# Patient Record
Sex: Male | Born: 1950 | Race: White | Hispanic: No | Marital: Married | Smoking: Never smoker
Health system: Southern US, Community
[De-identification: ages and names within clinical notes are randomized; demographics above are authoritative.]

## PROBLEM LIST (undated history)

## (undated) DIAGNOSIS — I82401 Acute embolism and thrombosis of unspecified deep veins of right lower extremity: Secondary | ICD-10-CM

## (undated) DIAGNOSIS — N200 Calculus of kidney: Secondary | ICD-10-CM

## (undated) DIAGNOSIS — Z973 Presence of spectacles and contact lenses: Secondary | ICD-10-CM

## (undated) DIAGNOSIS — I2699 Other pulmonary embolism without acute cor pulmonale: Secondary | ICD-10-CM

## (undated) DIAGNOSIS — Z87442 Personal history of urinary calculi: Secondary | ICD-10-CM

## (undated) DIAGNOSIS — R0602 Shortness of breath: Secondary | ICD-10-CM

## (undated) DIAGNOSIS — R911 Solitary pulmonary nodule: Secondary | ICD-10-CM

## (undated) HISTORY — PX: KNEE ARTHROSCOPY W/ ACL RECONSTRUCTION: SHX1858

## (undated) HISTORY — PX: LITHOTRIPSY: SUR834

## (undated) HISTORY — PX: COLONOSCOPY: SHX174

---

## 1979-06-30 HISTORY — PX: KNEE ARTHROSCOPY W/ ACL RECONSTRUCTION: SHX1858

## 1989-06-29 HISTORY — PX: LITHOTRIPSY: SUR834

## 2000-01-19 ENCOUNTER — Emergency Department (HOSPITAL_COMMUNITY): Admission: EM | Admit: 2000-01-19 | Discharge: 2000-01-20 | Payer: Self-pay | Admitting: Emergency Medicine

## 2001-12-16 ENCOUNTER — Emergency Department (HOSPITAL_COMMUNITY): Admission: EM | Admit: 2001-12-16 | Discharge: 2001-12-16 | Payer: Self-pay | Admitting: *Deleted

## 2001-12-16 ENCOUNTER — Encounter: Payer: Self-pay | Admitting: *Deleted

## 2004-10-28 ENCOUNTER — Ambulatory Visit: Payer: Self-pay | Admitting: Family Medicine

## 2004-11-02 ENCOUNTER — Ambulatory Visit: Payer: Self-pay | Admitting: Internal Medicine

## 2005-05-14 ENCOUNTER — Ambulatory Visit: Payer: Self-pay | Admitting: Internal Medicine

## 2005-05-16 ENCOUNTER — Ambulatory Visit: Payer: Self-pay | Admitting: Internal Medicine

## 2005-11-06 ENCOUNTER — Ambulatory Visit: Payer: Self-pay | Admitting: Internal Medicine

## 2005-12-05 ENCOUNTER — Ambulatory Visit: Payer: Self-pay | Admitting: Internal Medicine

## 2006-02-14 ENCOUNTER — Ambulatory Visit: Payer: Self-pay | Admitting: Internal Medicine

## 2007-08-25 DIAGNOSIS — Z860100 Personal history of colon polyps, unspecified: Secondary | ICD-10-CM | POA: Insufficient documentation

## 2007-08-25 DIAGNOSIS — Z87442 Personal history of urinary calculi: Secondary | ICD-10-CM | POA: Insufficient documentation

## 2007-08-25 DIAGNOSIS — L719 Rosacea, unspecified: Secondary | ICD-10-CM

## 2007-08-26 ENCOUNTER — Ambulatory Visit: Payer: Self-pay | Admitting: Internal Medicine

## 2007-08-26 DIAGNOSIS — N401 Enlarged prostate with lower urinary tract symptoms: Secondary | ICD-10-CM

## 2007-08-26 DIAGNOSIS — E782 Mixed hyperlipidemia: Secondary | ICD-10-CM | POA: Insufficient documentation

## 2007-08-26 DIAGNOSIS — N138 Other obstructive and reflux uropathy: Secondary | ICD-10-CM

## 2007-09-03 ENCOUNTER — Ambulatory Visit: Payer: Self-pay | Admitting: Internal Medicine

## 2007-09-10 ENCOUNTER — Encounter (INDEPENDENT_AMBULATORY_CARE_PROVIDER_SITE_OTHER): Payer: Self-pay | Admitting: *Deleted

## 2007-09-10 LAB — CONVERTED CEMR LAB
ALT: 29 units/L (ref 0–53)
AST: 24 units/L (ref 0–37)
Albumin: 3.7 g/dL (ref 3.5–5.2)
Alkaline Phosphatase: 98 units/L (ref 39–117)
BUN: 13 mg/dL (ref 6–23)
Basophils Absolute: 0 10*3/uL (ref 0.0–0.1)
Basophils Relative: 0 % (ref 0.0–1.0)
Bilirubin, Direct: 0.1 mg/dL (ref 0.0–0.3)
CO2: 27 meq/L (ref 19–32)
Calcium: 9.2 mg/dL (ref 8.4–10.5)
Chloride: 108 meq/L (ref 96–112)
Cholesterol: 232 mg/dL (ref 0–200)
Creatinine, Ser: 0.9 mg/dL (ref 0.4–1.5)
Direct LDL: 147.8 mg/dL
Eosinophils Absolute: 0.1 10*3/uL (ref 0.0–0.6)
Eosinophils Relative: 2.2 % (ref 0.0–5.0)
GFR calc Af Amer: 113 mL/min
GFR calc non Af Amer: 93 mL/min
Glucose, Bld: 82 mg/dL (ref 70–99)
HCT: 43.9 % (ref 39.0–52.0)
HDL: 41.7 mg/dL (ref >39.0)
Hemoglobin: 15.2 g/dL (ref 13.0–17.0)
Lymphocytes Relative: 41.7 % (ref 12.0–46.0)
MCHC: 34.6 g/dL (ref 30.0–36.0)
MCV: 89 fL (ref 78.0–100.0)
Monocytes Absolute: 0.4 10*3/uL (ref 0.2–0.7)
Monocytes Relative: 8 % (ref 3.0–11.0)
Neutro Abs: 2.8 10*3/uL (ref 1.4–7.7)
Neutrophils Relative %: 48.1 % (ref 43.0–77.0)
PSA: 2.83 ng/mL (ref 0.10–4.00)
Platelets: 184 10*3/uL (ref 150–400)
Potassium: 4.2 meq/L (ref 3.5–5.1)
RBC: 4.94 M/uL (ref 4.22–5.81)
RDW: 12.5 % (ref 11.5–14.6)
Sodium: 141 meq/L (ref 135–145)
Total Bilirubin: 1.1 mg/dL (ref 0.3–1.2)
Total CHOL/HDL Ratio: 5.6
Total Protein: 6.7 g/dL (ref 6.0–8.3)
Triglycerides: 195 mg/dL — ABNORMAL HIGH (ref 0–149)
VLDL: 39 mg/dL (ref 0–40)
WBC: 5.6 10*3/uL (ref 4.5–10.5)

## 2009-04-25 ENCOUNTER — Ambulatory Visit: Payer: Self-pay | Admitting: Internal Medicine

## 2009-04-25 DIAGNOSIS — H6980 Other specified disorders of Eustachian tube, unspecified ear: Secondary | ICD-10-CM

## 2009-04-25 LAB — CONVERTED CEMR LAB: Rapid Strep: NEGATIVE

## 2009-10-07 ENCOUNTER — Telehealth (INDEPENDENT_AMBULATORY_CARE_PROVIDER_SITE_OTHER): Payer: Self-pay | Admitting: *Deleted

## 2012-08-22 ENCOUNTER — Ambulatory Visit: Payer: Self-pay | Admitting: Family Medicine

## 2012-08-22 DIAGNOSIS — Z0289 Encounter for other administrative examinations: Secondary | ICD-10-CM

## 2013-05-26 ENCOUNTER — Ambulatory Visit
Admission: RE | Admit: 2013-05-26 | Discharge: 2013-05-26 | Disposition: A | Discharge status: Home / Self Care | Payer: No Typology Code available for payment source | Source: Ambulatory Visit | Attending: Family Medicine | Admitting: Family Medicine

## 2013-05-26 ENCOUNTER — Observation Stay (HOSPITAL_COMMUNITY)
Admission: EM | Admit: 2013-05-26 | Discharge: 2013-05-28 | Disposition: A | Discharge status: Home / Self Care | Payer: MEDICAID | Attending: Internal Medicine | Admitting: Internal Medicine

## 2013-05-26 ENCOUNTER — Encounter (HOSPITAL_COMMUNITY): Payer: Self-pay | Admitting: Emergency Medicine

## 2013-05-26 ENCOUNTER — Other Ambulatory Visit: Payer: Self-pay | Admitting: Family Medicine

## 2013-05-26 DIAGNOSIS — Z7901 Long term (current) use of anticoagulants: Secondary | ICD-10-CM | POA: Insufficient documentation

## 2013-05-26 DIAGNOSIS — M7989 Other specified soft tissue disorders: Secondary | ICD-10-CM

## 2013-05-26 DIAGNOSIS — R079 Chest pain, unspecified: Secondary | ICD-10-CM

## 2013-05-26 DIAGNOSIS — I82409 Acute embolism and thrombosis of unspecified deep veins of unspecified lower extremity: Secondary | ICD-10-CM | POA: Insufficient documentation

## 2013-05-26 DIAGNOSIS — I82401 Acute embolism and thrombosis of unspecified deep veins of right lower extremity: Secondary | ICD-10-CM

## 2013-05-26 DIAGNOSIS — I2699 Other pulmonary embolism without acute cor pulmonale: Secondary | ICD-10-CM

## 2013-05-26 HISTORY — DX: Calculus of kidney: N20.0

## 2013-05-26 HISTORY — DX: Acute embolism and thrombosis of unspecified deep veins of right lower extremity: I82.401

## 2013-05-26 HISTORY — DX: Other pulmonary embolism without acute cor pulmonale: I26.99

## 2013-05-26 HISTORY — DX: Shortness of breath: R06.02

## 2013-05-26 LAB — CBC WITH DIFFERENTIAL/PLATELET
Basophils Relative: 0 % (ref 0–1)
Eosinophils Absolute: 0 10*3/uL (ref 0.0–0.7)
Eosinophils Relative: 0 % (ref 0–5)
Lymphs Abs: 1.5 10*3/uL (ref 0.7–4.0)
MCH: 29.8 pg (ref 26.0–34.0)
MCHC: 34.5 g/dL (ref 30.0–36.0)
MCV: 86.5 fL (ref 78.0–100.0)
Neutrophils Relative %: 91 % — ABNORMAL HIGH (ref 43–77)
Platelets: 212 10*3/uL (ref 150–400)
RBC: 5.1 MIL/uL (ref 4.22–5.81)

## 2013-05-26 LAB — COMPREHENSIVE METABOLIC PANEL
ALT: 72 U/L — ABNORMAL HIGH (ref 0–53)
AST: 39 U/L — ABNORMAL HIGH (ref 0–37)
Albumin: 3.6 g/dL (ref 3.5–5.2)
Alkaline Phosphatase: 143 U/L — ABNORMAL HIGH (ref 39–117)
CO2: 23 mEq/L (ref 19–32)
Chloride: 103 mEq/L (ref 96–112)
GFR calc non Af Amer: >90 mL/min (ref >90)
Potassium: 4.9 mEq/L (ref 3.5–5.1)
Sodium: 137 mEq/L (ref 135–145)
Total Bilirubin: 0.8 mg/dL (ref 0.3–1.2)

## 2013-05-26 LAB — PROTIME-INR
INR: 0.95 (ref 0.00–1.49)
Prothrombin Time: 12.5 seconds (ref 11.6–15.2)

## 2013-05-26 MED ORDER — RIVAROXABAN 20 MG PO TABS: 20.0000 mg | ORAL_TABLET | Freq: Every day | ORAL | Status: DC

## 2013-05-26 MED ORDER — ACETAMINOPHEN 325 MG PO TABS: 650.0000 mg | ORAL_TABLET | Freq: Four times a day (QID) | ORAL | Status: DC | PRN

## 2013-05-26 MED ORDER — SODIUM CHLORIDE 0.9 % IV SOLN: 250.0000 mL | INTRAVENOUS | Status: DC | PRN

## 2013-05-26 MED ORDER — ENOXAPARIN SODIUM 120 MG/0.8ML ~~LOC~~ SOLN
105.0000 mg | SUBCUTANEOUS | Status: AC
  Administered 2013-05-26: 105 mg via SUBCUTANEOUS
  Filled 2013-05-26: qty 0.8

## 2013-05-26 MED ORDER — ENOXAPARIN SODIUM 120 MG/0.8ML ~~LOC~~ SOLN
105.0000 mg | Freq: Once | SUBCUTANEOUS | Status: DC
  Filled 2013-05-26: qty 0.8

## 2013-05-26 MED ORDER — ACETAMINOPHEN 650 MG RE SUPP: 650.0000 mg | Freq: Four times a day (QID) | RECTAL | Status: DC | PRN

## 2013-05-26 MED ORDER — ONDANSETRON HCL 4 MG/2ML IJ SOLN: 4.0000 mg | Freq: Four times a day (QID) | INTRAMUSCULAR | Status: DC | PRN

## 2013-05-26 MED ORDER — ONDANSETRON HCL 4 MG/2ML IJ SOLN: 4.0000 mg | Freq: Three times a day (TID) | INTRAMUSCULAR | Status: AC | PRN

## 2013-05-26 MED ORDER — ENOXAPARIN SODIUM 120 MG/0.8ML ~~LOC~~ SOLN: 105.0000 mg | Freq: Two times a day (BID) | SUBCUTANEOUS | Status: DC

## 2013-05-26 MED ORDER — SODIUM CHLORIDE 0.9 % IJ SOLN: 3.0000 mL | INTRAMUSCULAR | Status: DC | PRN

## 2013-05-26 MED ORDER — ONDANSETRON HCL 4 MG PO TABS: 4.0000 mg | ORAL_TABLET | Freq: Four times a day (QID) | ORAL | Status: DC | PRN

## 2013-05-26 MED ORDER — IOHEXOL 350 MG/ML SOLN: 125.0000 mL | Freq: Once | INTRAVENOUS | Status: AC | PRN

## 2013-05-26 MED ORDER — HYDROCORTISONE 1 % EX CREA
1.0000 "application " | TOPICAL_CREAM | Freq: Every day | CUTANEOUS | Status: DC | PRN
  Filled 2013-05-26: qty 28

## 2013-05-26 MED ORDER — PANTOPRAZOLE SODIUM 40 MG PO TBEC
40.0000 mg | DELAYED_RELEASE_TABLET | Freq: Every day | ORAL | Status: DC
  Administered 2013-05-26 – 2013-05-28 (×3): 40 mg via ORAL
  Filled 2013-05-26 (×3): qty 1

## 2013-05-26 MED ORDER — SODIUM CHLORIDE 0.9 % IJ SOLN
3.0000 mL | Freq: Two times a day (BID) | INTRAMUSCULAR | Status: DC
  Administered 2013-05-26: 3 mL via INTRAVENOUS

## 2013-05-26 MED ORDER — OXYCODONE HCL 5 MG PO TABS: 5.0000 mg | ORAL_TABLET | ORAL | Status: DC | PRN

## 2013-05-26 MED ORDER — RIVAROXABAN 15 MG PO TABS
15.0000 mg | ORAL_TABLET | Freq: Two times a day (BID) | ORAL | Status: DC
  Administered 2013-05-27 – 2013-05-28 (×3): 15 mg via ORAL
  Filled 2013-05-26 (×5): qty 1

## 2013-05-26 MED ORDER — SODIUM CHLORIDE 0.9 % IJ SOLN
3.0000 mL | Freq: Two times a day (BID) | INTRAMUSCULAR | Status: DC
  Administered 2013-05-28: 3 mL via INTRAVENOUS

## 2013-05-26 MED ORDER — IBUPROFEN 400 MG PO TABS
400.0000 mg | ORAL_TABLET | Freq: Three times a day (TID) | ORAL | Status: AC
  Administered 2013-05-26: 400 mg via ORAL
  Filled 2013-05-26: qty 1

## 2013-05-26 NOTE — ED Provider Notes (Signed)
Medical screening examination/treatment/procedure(s) were performed by non-physician practitioner and as supervising physician I was immediately available for consultation/collaboration.   Gwyneth Sprout, MD 05/26/13 1840

## 2013-05-26 NOTE — Progress Notes (Signed)
ANTICOAGULATION CONSULT NOTE - Initial Consult  Pharmacy Consult for lovenox Indication: pulmonary embolus  No Known Allergies  Patient Measurements: weight= 105 kg, height 70 inches   Vital Signs: Temp: 98.1 F (36.7 C) (07/29 1352) Temp src: Oral (07/29 1352) BP: 121/87 mmHg (07/29 1446) Pulse Rate: 67 (07/29 1446)  Labs:  Recent Labs  05/26/13 1425  HGB 15.2  HCT 44.1  PLT 212    CrCl is unknown because no creatinine reading has been taken and the patient has no height on file.   Medical History: History reviewed. No pertinent past medical history.  Medications:  See med rec  Assessment: Patient is a 62 y.o M stated that he went on a 2,200 mi road trip back in June and has been experiencing SOB since then.  His PCP referred him to an outpatient radiology center this morning for a chest CT and patient reported that they told him to report immediately to the ED b/c of a clot noted in his lung. Per patient, not on any blood thinners at home, no issues with his kidneys, or has any bleeding condition.  Goal of Therapy:  Anti-Xa level 0.6-1.2 units/ml 4hrs after LMWH dose given Monitor platelets by anticoagulation protocol: Yes   Plan:  1) lovenox 105mg  SQ q12h 2) CBC Q72h  Tawana Pasch P 05/26/2013,3:00 PM

## 2013-05-26 NOTE — ED Provider Notes (Signed)
CSN: [id number]     Arrival date & time 05/26/13  1348 History     First MD Initiated Contact with Patient 05/26/13 1428     Chief Complaint  Patient presents with  . Shortness of Breath   (Consider location/radiation/quality/duration/timing/severity/associated sxs/prior Treatment) HPI Comments: Patient presents to the emergency department after being diagnosed with a pulmonary embolism earlier today. Patient states that for the past year or so he has had right calf pain is worsened with driving. Patient states that he travels frequently, multiple long road trips. He recently describes a trip where he traveled a 2200 miles in 4 days. Over the past 5 weeks he has had left-sided pleuritic chest pain. Patient was seen and treated with an antibiotic which did not help. He has been prescribed prednisone which improved his pain greatly. Pain returned when he discontinued. He is also treated with ibuprofen. CT scan of chest was ordered today which showed a pulmonary embolism. He also was diagnosed with right lower extremity DVT by ultrasound. Patient does not report worsening dyspnea with exertion. He walked 2 miles this weekend in the park without difficulty. He denies fever or vomiting. Patient denies history of blood clots or family history of blood clots. He is currently on no medications other than ibuprofen and prednisone. Onset of symptoms acute. Course is waxing and waning. Nothing makes symptoms worse.  Patient is a 62 y.o. male presenting with shortness of breath. The history is provided by the patient.  Shortness of Breath Associated symptoms: chest pain   Associated symptoms: no abdominal pain, no cough, no fever, no headaches, no rash, no sore throat and no vomiting     History reviewed. No pertinent past medical history. History reviewed. No pertinent past surgical history. No family history on file. History  Substance Use Topics  . Smoking status: Not on file  . Smokeless tobacco:  Not on file  . Alcohol Use: No    Review of Systems  Constitutional: Negative for fever.  HENT: Negative for sore throat and rhinorrhea.   Eyes: Negative for redness.  Respiratory: Negative for cough and shortness of breath.   Cardiovascular: Positive for chest pain and leg swelling.  Gastrointestinal: Negative for nausea, vomiting, abdominal pain and diarrhea.  Genitourinary: Negative for dysuria.  Musculoskeletal: Negative for myalgias.  Skin: Negative for rash.  Neurological: Negative for headaches.    Allergies  Review of patient's allergies indicates no known allergies.  Home Medications   Current Outpatient Rx  Name  Route  Sig  Dispense  Refill  . hydrocortisone cream 1 %   Topical   Apply 1-3 application topically daily as needed (Skin irritation). Apply to bridge of nose and/or chest         . ibuprofen (ADVIL,MOTRIN) 200 MG tablet   Oral   Take 400 mg by mouth every 6 (six) hours as needed for pain.         Marland Kitchen PRESCRIPTION MEDICATION   Oral   Take 4 tablets by mouth 2 (two) times daily. Prednisone taper--took two this morning, four last night, two more for tonight. Had tapered off last Thursday         . PRESCRIPTION MEDICATION   Oral   Take 1 tablet by mouth 2 (two) times daily.          BP 121/87  Pulse 67  Temp(Src) 98.1 F (36.7 C) (Oral)  Resp 18  SpO2 96% Physical Exam  Nursing note and vitals reviewed. Constitutional: He appears  well-developed and well-nourished.  HENT:  Head: Normocephalic and atraumatic.  Eyes: Conjunctivae are normal. Right eye exhibits no discharge. Left eye exhibits no discharge.  Neck: Normal range of motion. Neck supple.  Cardiovascular: Normal rate, regular rhythm and normal heart sounds.   Pulmonary/Chest: Effort normal and breath sounds normal.  Abdominal: Soft. There is no tenderness.  Musculoskeletal: He exhibits tenderness (right calf). He exhibits no edema.  Neurological: He is alert.  Skin: Skin is warm  and dry.  Psychiatric: He has a normal mood and affect.    ED Course   Procedures (including critical care time)  Labs Reviewed  COMPREHENSIVE METABOLIC PANEL - Abnormal; Notable for the following:    Glucose, Bld 125 (*)    AST 39 (*)    ALT 72 (*)    Alkaline Phosphatase 143 (*)    All other components within normal limits  CBC WITH DIFFERENTIAL - Abnormal; Notable for the following:    WBC 21.2 (*)    Neutrophils Relative % 91 (*)    Neutro Abs 19.1 (*)    Lymphocytes Relative 7 (*)    All other components within normal limits  PROTIME-INR   No results found. 1. Pulmonary embolism and infarction     3:09 PM Patient seen and examined. Called [facility] to obtain CT reading. Work-up initiated. Medications ordered.   Vital signs reviewed and are as follows: Filed Vitals:   05/26/13 1446  BP: 121/87  Pulse: 67  Temp:   Resp: 18   4:08 PM CT results obtained.   D/w Dr. Anitra Lauth.   Spoke with [facility] who will see and admit.    MDM  Patient with PE/DVT, likely 2/2 stasis during long car trips. Patient is stable. Vitals WNL.   Renne Crigler, PA-C 05/26/13 1609

## 2013-05-26 NOTE — ED Notes (Signed)
Pt. Stated, I've been SOB for about 5 weeks, did a scan today and showed I have a clot in my lung.

## 2013-05-26 NOTE — Progress Notes (Signed)
ANTICOAGULATION CONSULT NOTE - Initial Consult  Pharmacy Consult for Xarelto Indication: pulmonary embolus  No Known Allergies  Patient Measurements: Height: 5\' 10"  (177.8 cm) Weight: 225 lb 12 oz (102.4 kg) IBW/kg (Calculated) : 73   Vital Signs: Temp: 97.8 F (36.6 C) (07/29 1725) Temp src: Oral (07/29 1725) BP: 131/75 mmHg (07/29 1725) Pulse Rate: 68 (07/29 1725)  Labs:  Recent Labs  05/26/13 1425  HGB 15.2  HCT 44.1  PLT 212  LABPROT 12.5  INR 0.95  CREATININE 0.91    Estimated Creatinine Clearance: 103.5 ml/min (by C-G formula based on Cr of 0.91).   Medical History: History reviewed. No pertinent past medical history.  Assessment: 71 YOM presents with worsening SOB and pleuritic CP after 2200 mile drive in June- was treated with Levaquin for presumed infection as well as Percocet, but discovered to have a PE and DVT. Was started on therapeutic Lovenox for treatment, last dose received at 1530 this afternoon. He is now to transition to Xarelto dose by pharmacy in the morning per Dr. Suanne Marker. His CBC is WNL and baseline PT/INR are WNL.  Goal of Therapy:    Monitor platelets by anticoagulation protocol: Yes   Plan:  1. Start Xarelto 15mg  BID with food tomorrow morning x21 days (last day 8/19), followed by Xarelto 20mg  daily with supper 2. CBC Q72hr  3. Follow up signs/symptoms bleeding, and renal function   Danialle Dement D. Etta Gassett, PharmD Clinical Pharmacist Pager: [phone] 05/26/2013 6:09 PM

## 2013-05-26 NOTE — H&P (Addendum)
[facility] History and Physical  Quinlin Conant UJW:[id number] DOB: November 12, 1950 DOA: 05/26/2013  Referring physician: Dr. Anitra Lauth PCP: [facility] Specialists:   Chief Complaint: Pleuritic chest pain and worsening shortness of breath.  HPI: Juan [name] is a 62 y.o. male with no significant past medical history who presents with above complaints. He states that he has had progressive shortness of breath, pleuritic chest pain for the past 3-4 weeks and intermittent right calf pain. He reports that his symptoms started after he had completed a four-day 2200 mile drive. He went to his PCP sometime thereafter and was prescribed a Levaquin for infection but that did not help his symptoms. So he subsequently followup with Dr. Tiburcio Pea with [facility] complaining of the pleuritic pain  and was prescribed Percocet and prednisone this controlled his symptoms until last Friday when he ran out. Since then the pleuritic pain and shortness of breath has been worsening and so he went back to his PCP and also are reported to intermittent R. calf pain and so was set up for CT of chest and Doppler US of his right lower extremity. These were done today in at [facility] imaging and revealed bilateral lower lobe and left upper PE; it is also reported that the Doppler of the lower extremity was positive for DVT. He is admitted for further evaluation and management.  Review of Systems: The patient denies anorexia, fever, weight loss,, vision loss, decreased hearing, hoarseness, syncope,, peripheral edema, balance deficits, hemoptysis, abdominal pain, melena, hematochezia, severe indigestion/heartburn, hematuria, incontinence, genital sores, muscle weakness, suspicious skin lesions, transient blindness, difficulty walking, depression, unusual weight change, abnormal bleeding, enlarged lymph nodes, angioedema.   History reviewed. No pertinent past medical history. History reviewed. No pertinent past surgical  history. Social History:  reports that he does not drink alcohol or use illicit drugs. His tobacco history is not on file.  where does patient live--home  Can patient participate in ADLs-yes  No Known Allergies  Family history-his father had coronary artery disease/status post CABG  Prior to Admission medications   Medication Sig Start Date End Date Taking? Authorizing Provider  hydrocortisone cream 1 % Apply 1-3 application topically daily as needed (Skin irritation). Apply to bridge of nose and/or chest   Yes Historical Provider, MD  ibuprofen (ADVIL,MOTRIN) 200 MG tablet Take 400 mg by mouth every 6 (six) hours as needed for pain.   Yes Historical Provider, MD  predniSONE (DELTASONE) 10 MG tablet Take 10-40 mg by mouth daily. Take prednisone for 8 total days.  For the first two days, take 4 tablets each day.  For days 3-4, take 3 tablets each day.  For days 5-6, take 2 tablets each day.  For days 7-8, take 1 tablet each day.   Yes Historical Provider, MD   Physical Exam: Filed Vitals:   05/26/13 1600 05/26/13 1630 05/26/13 1700 05/26/13 1725  BP: 126/87 128/81 130/116 131/75  Pulse: 63 67 69 68  Temp:    97.8 F (36.6 C)  TempSrc:    Oral  Resp: 18 22 18 20   Height:    5\' 10"  (1.778 m)  Weight:    102.4 kg (225 lb 12 oz)  SpO2: 97% 96% 100% 100%    Constitutional: Vital signs reviewed.  Patient is a well-developed and well-nourished  in no acute distress and cooperative with exam. Alert and oriented x3.  Head: Normocephalic and atraumatic Mouth: no erythema or exudates, MMM Eyes: PERRL, EOMI, conjunctivae normal, No scleral icterus.  Neck: Supple,  Trachea midline normal ROM, No JVD, mass, thyromegaly, or carotid bruit present.  Cardiovascular: RRR, S1 normal, S2 normal, no MRG, pulses symmetric and intact bilaterally Pulmonary/Chest: normal respiratory effort, CTAB, no wheezes, rales, or rhonchi Abdominal: Soft. Non-tender, non-distended, bowel sounds are normal, no masses,  organomegaly, or guarding present.  GU: no CVA tenderness  Extremities: No cyanosis and no edema, no calf tenderness  Neurological: A&O x3, Strength is normal and symmetric bilaterally, cranial nerve II-XII are grossly intact, no focal motor deficit, sensory intact to light touch bilaterally.  Skin: Warm, dry and intact. No rash.  Psychiatric: Normal mood and affect. speech and behavior is normal. Judgment and thought content normal. Cognition and memory are normal.     Labs on Admission:  Basic Metabolic Panel:  Recent Labs Lab 05/26/13 1425  NA 137  K 4.9  CL 103  CO2 23  GLUCOSE 125*  BUN 20  CREATININE 0.91  CALCIUM 9.7   Liver Function Tests:  Recent Labs Lab 05/26/13 1425  AST 39*  ALT 72*  ALKPHOS 143*  BILITOT 0.8  PROT 7.6  ALBUMIN 3.6   No results found for this basename: LIPASE, AMYLASE,  in the last 168 hours No results found for this basename: AMMONIA,  in the last 168 hours CBC:  Recent Labs Lab 05/26/13 1425  WBC 21.2*  NEUTROABS 19.1*  HGB 15.2  HCT 44.1  MCV 86.5  PLT 212   Cardiac Enzymes: No results found for this basename: CKTOTAL, CKMB, CKMBINDEX, TROPONINI,  in the last 168 hours  BNP (last 3 results) No results found for this basename: PROBNP,  in the last 8760 hours CBG: No results found for this basename: GLUCAP,  in the last 168 hours  Radiological Exams on Admission: No results found.    Assessment/Plan Active Problems:   PE (pulmonary embolism)/Right leg DVT -As discussed above, secondary to immobility with extended long distance driving -Therapeutic Lovenox, start xarelto for PE beginning in a.m per pharmacy -Pain management and follow  Leukocytosis -Likely secondary to steroids -Will obtain UA/culture follow and further treat accordingly -Follow and recheck in a.m. -He is afebrile and hemodynamically stable will hold off antibiotics for now History of kidney stones     Code Status: Full code Family  Communication: None at bedside Disposition Plan: Admit to telemetry for observation  Time spent: >30  Kela Millin [facility] Pager [phone]  If 7PM-7AM, please contact night-coverage [email or web address] Password [facility] 05/26/2013, 5:28 PM

## 2013-05-27 ENCOUNTER — Observation Stay (HOSPITAL_COMMUNITY): Payer: 59

## 2013-05-27 DIAGNOSIS — I369 Nonrheumatic tricuspid valve disorder, unspecified: Secondary | ICD-10-CM

## 2013-05-27 LAB — CBC
HCT: 39.9 % (ref 39.0–52.0)
MCHC: 33.3 g/dL (ref 30.0–36.0)
MCV: 87.7 fL (ref 78.0–100.0)
RDW: 14.3 % (ref 11.5–15.5)

## 2013-05-27 LAB — URINE MICROSCOPIC-ADD ON

## 2013-05-27 LAB — BASIC METABOLIC PANEL
BUN: 24 mg/dL — ABNORMAL HIGH (ref 6–23)
Creatinine, Ser: 1.09 mg/dL (ref 0.50–1.35)
GFR calc Af Amer: 83 mL/min — ABNORMAL LOW (ref >90)
GFR calc non Af Amer: 72 mL/min — ABNORMAL LOW (ref >90)
Potassium: 3.7 mEq/L (ref 3.5–5.1)

## 2013-05-27 LAB — URINALYSIS, ROUTINE W REFLEX MICROSCOPIC
Leukocytes, UA: NEGATIVE
Nitrite: NEGATIVE
Protein, ur: NEGATIVE mg/dL
Urobilinogen, UA: 0.2 mg/dL (ref 0.0–1.0)

## 2013-05-27 NOTE — Progress Notes (Signed)
[facility]                                                                                Patient Demographics  Juan [name], is a 62 y.o. male, DOB - [birth year], YNW:[id number], MVH:[id number]  Admit date - 05/26/2013  Admitting Physician Kela Millin, MD  Outpatient Primary MD for the patient is No PCP Per Patient  LOS - 1   Chief Complaint  Patient presents with  . Shortness of Breath        Assessment & Plan    1.  PE - Right leg DVT diagnosed outpatient - appears to be provoked due to prolonged driving history, has been transitioned to xaralto, feels better, had one episode of nonspecific chest pains morning, EKG is unremarkable, will obtain echo gram and serial troponins. If negative will be discharged tomorrow on xaralto.   2. Mild nonspecific leukocytosis was reactionary plus due to steroids that he was recently on, afebrile, leukocytosis trend improving. Monitor clinically. Will check a 2 view chest x-ray and a UA.   Code Status: Full  Family Communication:  None present  Disposition Plan: Home   Procedures CT Angio, Echo   Consults      DVT Prophylaxis  Xaralto  Lab Results  Component Value Date   PLT 169 05/27/2013    Medications  Scheduled Meds: . pantoprazole  40 mg Oral Daily  . rivaroxaban  15 mg Oral BID WC   Followed by  . [START ON 06/17/2013] rivaroxaban  20 mg Oral Q supper  . sodium chloride  3 mL Intravenous Q12H  . sodium chloride  3 mL Intravenous Q12H   Continuous Infusions:  PRN Meds:.sodium chloride, acetaminophen, acetaminophen, hydrocortisone cream, ondansetron (ZOFRAN) IV, ondansetron, oxyCODONE, sodium chloride  Antibiotics     Anti-infectives   None       Time Spent in minutes   35   SINGH,PRASHANT K M.D on 05/27/2013 at 2:15 PM  Between 7am to 7pm - Pager - [phone]  After 7pm go to [email or web address] - password [email or web address]  And look for the night coverage person covering for me after hours  [facility] Office  [phone]    Subjective:   Juan [name] today has, No headache, No chest pain, No abdominal pain - No Nausea, No new weakness tingling or numbness, No Cough - SOB.   Objective:   Filed Vitals:   05/27/13 0454 05/27/13 0602 05/27/13 0945 05/27/13 1356  BP: 76/46 98/58 105/64 103/62  Pulse: 80 80 64 74  Temp: 97.8 F (36.6 C)  98.2 F (36.8 C) 98 F (36.7 C)  TempSrc: Oral  Oral Oral  Resp: 18  16 16   Height:      Weight:      SpO2: 96%  99% 99%    Wt Readings from Last 3 Encounters:  05/26/13 102.4 kg (225 lb 12 oz)     Intake/Output Summary (Last 24 hours) at 05/27/13 1415 Last data filed at 05/27/13 0939  Gross per 24 hour  Intake    360 ml  Output      0 ml  Net    360 ml    Exam  Awake Alert, Oriented X 3, No new F.N deficits, Normal affect [place].AT,PERRAL Supple Neck,No JVD, No cervical lymphadenopathy appriciated.  Symmetrical Chest wall movement, Good air movement bilaterally, CTAB RRR,No Gallops,Rubs or new Murmurs, No Parasternal Heave +ve B.Sounds, Abd Soft, Non tender, No organomegaly appriciated, No rebound - guarding or rigidity. No Cyanosis, Clubbing or edema, No new Rash or bruise    Data Review   Micro Results No results found for this or any previous visit (from the past 240 hour(s)).  Radiology Reports No results found.  CBC  Recent Labs Lab 05/26/13 1425 05/27/13 0530  WBC 21.2* 14.7*  HGB 15.2 13.3  HCT 44.1 39.9  PLT 212 169  MCV 86.5 87.7  MCH 29.8 29.2  MCHC 34.5 33.3  RDW 13.9 14.3  LYMPHSABS 1.5  --   MONOABS 0.6  --   EOSABS 0.0  --   BASOSABS 0.0  --     Chemistries   Recent Labs Lab 05/26/13 1425 05/27/13 0530  NA 137 139  K 4.9 3.7  CL 103 105  CO2 23 25  GLUCOSE 125* 95  BUN 20 24*  CREATININE 0.91 1.09  CALCIUM 9.7 8.5  AST 39*  --   ALT 72*  --   ALKPHOS 143*  --   BILITOT 0.8  --     ------------------------------------------------------------------------------------------------------------------ estimated creatinine clearance is 86.4 ml/min (by C-G formula based on Cr of 1.09). ------------------------------------------------------------------------------------------------------------------ No results found for this basename: HGBA1C,  in the last 72 hours ------------------------------------------------------------------------------------------------------------------ No results found for this basename: CHOL, HDL, LDLCALC, TRIG, CHOLHDL, LDLDIRECT,  in the last 72 hours ------------------------------------------------------------------------------------------------------------------ No results found for this basename: TSH, T4TOTAL, FREET3, T3FREE, THYROIDAB,  in the last 72 hours ------------------------------------------------------------------------------------------------------------------ No results found for this basename: VITAMINB12, FOLATE, FERRITIN, TIBC, IRON, RETICCTPCT,  in the last 72 hours  Coagulation profile  Recent Labs Lab 05/26/13 1425  INR 0.95    No results found for this basename: DDIMER,  in the last 72 hours  Cardiac Enzymes  Recent Labs Lab 05/27/13 1022  TROPONINI <0.30   ------------------------------------------------------------------------------------------------------------------ No components found with this basename: POCBNP,

## 2013-05-27 NOTE — Progress Notes (Signed)
  Echocardiogram 2D Echocardiogram has been performed.  Juan [name] FRANCES 05/27/2013, 6:33 PM

## 2013-05-27 NOTE — Progress Notes (Signed)
Patient c/o mild Rt chest pain.  Rates it 2/10.  VSS. Telemetry looks OK.  Repositioned patient with HOB elevated. States that pain is already better and he compared it to previous pain (pleuritic pain).  Notified MD by page.  Will cont to monitor.

## 2013-05-28 LAB — URINE CULTURE
Colony Count: NO GROWTH
Culture: NO GROWTH

## 2013-05-28 LAB — CBC
MCV: 88.3 fL (ref 78.0–100.0)
Platelets: 180 10*3/uL (ref 150–400)
RDW: 14.6 % (ref 11.5–15.5)
WBC: 9.2 10*3/uL (ref 4.0–10.5)

## 2013-05-28 MED ORDER — RIVAROXABAN 15 MG PO TABS: 15.0000 mg | ORAL_TABLET | Freq: Two times a day (BID) | ORAL | Status: DC

## 2013-05-28 NOTE — Progress Notes (Signed)
Patient discharged to home with wife at 1200. AVS summary signed, discharge instructions completed, pt and wife had no further questions and verbalized understanding of discharge instructions. Hedy Camara

## 2013-05-28 NOTE — Progress Notes (Signed)
05-27-13 Spoke with Gildardo Pounds with Xarelto.  First 21 days for Xarelto will be covered by Free 30 day trial card ( given to patient )  . Then if patient is followed up at [facility] and [facility] they will complete application for Xarelto assistance for after 21 days .     Spoke with Barbaraann Boys at [facility] and confirmed this . Patient has appointment Thursday August 7 , 2014 at 1315.  He is to bring photo ID , Medication bottles , and $20 co pay . ( If patient does not have $20 co pay he will not be turned away)  .  They will also assist patient with orange card application.    Above explained to patient . Patient voiced understanding .   Ronny Flurry RN BSN [phone]

## 2013-05-28 NOTE — Discharge Summary (Addendum)
[facility]                                                                                   Juan [name], is a 62 y.o. male  DOB [birth year]  MRN [id number].  Admission date:  05/26/2013  Discharge Date:  05/28/2013  Primary MD  No PCP Per Patient  Admitting Physician  Kela Millin, MD  Admission Diagnosis  Pulmonary embolism and infarction [415.19]  Discharge Diagnosis     Active Problems:   PE (pulmonary embolism)   Right leg DVT    Past Medical History  Diagnosis Date  . Kidney stones   . Pulmonary embolism 05/26/2013  . Right leg DVT 05/26/2013  . Shortness of breath     "@ anytime over the last 5 weeks; probably from the blood clots" (05/26/2013)    Past Surgical History  Procedure Laterality Date  . Lithotripsy  1990's  . Knee arthroscopy w/ acl reconstruction Right 1980's     Recommendations for primary care physician for things to follow:    Follow on chest x-ray and CMP, possible lung nodule on CT scan, mildly elevated liver enzymes on admission likely due to right-sided heart strain from bilateral PE.   Discharge Diagnoses:   Active Problems:   PE (pulmonary embolism)   Right leg DVT    Discharge Condition:Stable   Diet recommendation: See Discharge Instructions below   Consults      History of present illness and  Hospital Course:     Kindly see H&P for history of present illness and admission details, please review complete Labs, Consult reports and Test reports for all details in brief Juan [name], is a 62 y.o. male, patient with no past medical history who was admitted to the hospital after he had some chest pain and shortness of breath and his outpatient workup by his primary care physician showed bilateral PE on CT scan in her right leg DVT on ultrasound, patient has extensive history of long drives, he does not have any personal or family history of blood clots. He was initially kept on Lovenox thereafter he has been switched  to xaralto, he will follow with his PCP at [facility] or [facility] for close followup.   Was incidentally noted to have a lung nodule on the CT scan than outpatient will request him to follow with pulmonary one time, he will also follow one time with hematology for blood clots, also on his admission he had mild elevation of his liver enzymes likely due to right heart strain from bilateral PEs. He no right upper quadrant pain or discomfort. We'll request PCP to repeat his Liver enzymes in 1 week. Patient has been given written instructions for these followup also.  Today he is completely chest pain-free. He has no shortness of breath and is eager to go home.         Today   Subjective:   Juan [name] today has no headache,no chest abdominal pain,no new weakness tingling or numbness, feels much better wants to go home today.    Objective:   Blood pressure 110/65 (manual repeat) , pulse 56, temperature  97.6 F (36.4 C), temperature source Oral, resp. rate 16, height 5\' 10"  (1.778 m), weight 102.4 kg (225 lb 12 oz), SpO2 97.00%.   Intake/Output Summary (Last 24 hours) at 05/28/13 1023 Last data filed at 05/28/13 1010  Gross per 24 hour  Intake   1120 ml  Output      0 ml  Net   1120 ml    Exam Awake Alert, Oriented *3, No new F.N deficits, Normal affect .AT,PERRAL Supple Neck,No JVD, No cervical lymphadenopathy appriciated.  Symmetrical Chest wall movement, Good air movement bilaterally, CTAB RRR,No Gallops,Rubs or new Murmurs, No Parasternal Heave +ve B.Sounds, Abd Soft, Non tender, No organomegaly appriciated, No rebound -guarding or rigidity. No Cyanosis, Clubbing or edema, No new Rash or bruise  Data Review   Major procedures and Radiology Reports - PLEASE review detailed and final reports for all details, in brief -   Outpatient CT scan and lower extremity ultrasound showing bilateral PE and right leg DVT.   Echo 05-28-13  The  cavity size was normal. Wall thickness was increased in a pattern of mild LVH. Systolic function was vigorous. The estimated ejection fraction was in the range of 65% to 70%. Wall motion was normal; there were no regional wall motion abnormalities. Left ventricular diastolic function parameters were normal.     Dg Chest 2 View  05/27/2013   *RADIOLOGY REPORT*  Clinical Data: Leukocytosis  CHEST - 2 VIEW  Comparison: None.  Findings: Cardiomediastinal silhouette appears normal.  No acute pulmonary disease is noted.  Bony thorax is intact.  IMPRESSION: No acute cardiopulmonary abnormality seen.   Original Report Authenticated By: Lupita Raider.,  M.D.    Micro Results      No results found for this or any previous visit (from the past 240 hour(s)).   CBC w Diff: Lab Results  Component Value Date   WBC 9.2 05/28/2013   HGB 14.1 05/28/2013   HCT 40.9 05/28/2013   PLT 180 05/28/2013   LYMPHOPCT 7* 05/26/2013   MONOPCT 3 05/26/2013   EOSPCT 0 05/26/2013   BASOPCT 0 05/26/2013    CMP: Lab Results  Component Value Date   NA 139 05/27/2013   K 3.7 05/27/2013   CL 105 05/27/2013   CO2 25 05/27/2013   BUN 24* 05/27/2013   CREATININE 1.09 05/27/2013   PROT 7.6 05/26/2013   ALBUMIN 3.6 05/26/2013   BILITOT 0.8 05/26/2013   ALKPHOS 143* 05/26/2013   AST 39* 05/26/2013   ALT 72* 05/26/2013  .   Discharge Instructions     [facility] . Patient has appointment Thursday August 7 , 2014 at 1315.  He is to bring photo ID , Medication bottles , and $20 co pay . ( If patient does not have $20 co pay he will not be turned away)  .  They will also assist patient with orange card application.    Follow with Primary MD at [facility] physicians or as requested above in 7 days   Get CBC, CMP, checked 7 days by Primary MD and again as instructed by your Primary MD. Get a 2 view Chest X ray done next visit to followup on lung nodule. Also follow on your liver enzymes.  Get Medicines reviewed  and adjusted.  Please request your Prim.MD to go over all Hospital Tests and Procedure/Radiological results at the follow up, please get all Hospital records sent to your Prim MD by signing hospital release before you go home.  Activity: As tolerated with Full fall precautions use walker/cane & assistance as needed   Diet:  Heart Health  For Heart failure patients - Check your Weight same time everyday, if you gain over 2 pounds, or you develop in leg swelling, experience more shortness of breath or chest pain, call your Primary MD immediately. Follow Cardiac Low Salt Diet and 1.8 lit/day fluid restriction.  Disposition Home   If you experience worsening of your admission symptoms, develop shortness of breath, life threatening emergency, suicidal or homicidal thoughts you must seek medical attention immediately by calling 911 or calling your MD immediately  if symptoms less severe.  You Must read complete instructions/literature along with all the possible adverse reactions/side effects for all the Medicines you take and that have been prescribed to you. Take any new Medicines after you have completely understood and accpet all the possible adverse reactions/side effects.   Do not drive and provide baby sitting services if your were admitted for syncope or siezures until you have seen by Primary MD or a Neurologist and advised to do so again.  Do not drive when taking Pain medications.    Do not take more than prescribed Pain, Sleep and Anxiety Medications  Special Instructions: If you have smoked or chewed Tobacco  in the last 2 yrs please stop smoking, stop any regular Alcohol  and or any Recreational drug use.  Wear Seat belts while driving.   Please note  You were cared for by a hospitalist during your hospital stay. If you have any questions about your discharge medications or the care you received while you were in the hospital after you are discharged, you can call the unit and  asked to speak with the hospitalist on call if the hospitalist that took care of you is not available. Once you are discharged, your primary care physician will handle any further medical issues. Please note that NO REFILLS for any discharge medications will be authorized once you are discharged, as it is imperative that you return to your primary care physician (or establish a relationship with a primary care physician if you do not have one) for your aftercare needs so that they can reassess your need for medications and monitor your lab values.      Follow-up Information   Follow up with [facility] physicians. Schedule an appointment as soon as possible for a visit in 1 week. (Follow on your CT scan and leg ultrasound results, you could have a lung nodule noted on the CT scan follow on that report.)       Follow up with Levert Feinstein, MD. Schedule an appointment as soon as possible for a visit in 2 months. (For blood clots)    Contact information:   [address] [place] [address] [phone]       Follow up with [facility], MD. Schedule an appointment as soon as possible for a visit in 2 months. (PE, possible lung nodule on CT scan)    Contact information:   [address] [place] [address] [phone]         Discharge Medications     Medication List         hydrocortisone cream 1 %  Apply 1-3 application topically daily as needed (Skin irritation). Apply to bridge of nose and/or chest     ibuprofen 200 MG tablet  Commonly known as:  ADVIL,MOTRIN  Take 400 mg by mouth every 6 (six) hours as needed for pain.  predniSONE 10 MG tablet  Commonly known as:  DELTASONE  Take 10-40 mg by mouth daily. Take prednisone for 8 total days.  For the first two days, take 4 tablets each day.  For days 3-4, take 3 tablets each day.  For days 5-6, take 2 tablets each day.  For days 7-8, take 1 tablet each day.     Rivaroxaban 15 MG Tabs tablet  Commonly known as:   XARELTO  Take 1 tablet (15 mg total) by mouth 2 (two) times daily with a meal. Follow with her primary care physician one week prior to this medication expires for change in dose           Total Time in preparing paper work, data evaluation and todays exam - 35 minutes  Leroy Sea M.D on 05/28/2013 at 10:23 AM  [facility] Office  [phone]

## 2013-06-04 ENCOUNTER — Inpatient Hospital Stay: Payer: 59

## 2013-11-25 ENCOUNTER — Telehealth: Payer: Self-pay | Admitting: Oncology

## 2013-11-25 NOTE — Telephone Encounter (Signed)
S/w patient and gave new patient appt 01/30 @ 1:30 w/Dr. [name] Referring Dr. Harlan Stains Dx- PE, duration of antiocoagulation

## 2013-11-26 ENCOUNTER — Telehealth: Payer: Self-pay | Admitting: Oncology

## 2013-11-26 NOTE — Telephone Encounter (Signed)
C/D 11/26/13 for appt. 11/27/13

## 2013-11-27 ENCOUNTER — Ambulatory Visit: Payer: 59

## 2013-11-27 ENCOUNTER — Other Ambulatory Visit: Payer: 59

## 2013-11-27 ENCOUNTER — Encounter: Payer: Self-pay | Admitting: Oncology

## 2013-11-27 ENCOUNTER — Ambulatory Visit (HOSPITAL_BASED_OUTPATIENT_CLINIC_OR_DEPARTMENT_OTHER): Payer: 59 | Admitting: Oncology

## 2013-11-27 VITALS — BP 125/79 | HR 71 | Temp 98.6°F | Resp 20 | Ht 70.0 in | Wt 243.5 lb

## 2013-11-27 DIAGNOSIS — I82409 Acute embolism and thrombosis of unspecified deep veins of unspecified lower extremity: Secondary | ICD-10-CM

## 2013-11-27 DIAGNOSIS — I2699 Other pulmonary embolism without acute cor pulmonale: Secondary | ICD-10-CM

## 2013-11-27 NOTE — Progress Notes (Signed)
Please see consult note.  

## 2013-11-27 NOTE — Consult Note (Signed)
Reason for Referral: Deep vein thrombosis and pulmonary embolism.   HPI: Juan [name] is a 63 y.o. male  currently of [place] with no significant past medical history. He was in his usual state of health till he pres with symptoms of chest pain or shortness of breath and July of 2014. He states that he has had progressive shortness of breath, pleuritic chest pain for the past 3-4 weeks and intermittent right calf pain. He reports that his symptoms started after he had completed a four-day [address] mile [address]. He went to his PCP sometime thereafter and was prescribed a Levaquin for infection but that did not help his symptoms. So he subsequently followup with Dr. Kenton Kingfisher with [facility] complaining of the pleuritic pain and was prescribed Percocet and prednisone this controlled his symptoms until last Friday when he ran out. Since then the pleuritic pain and shortness of breath has been worsening and so he went back to his PCP and also are reported to intermittent R. calf pain and so was set up for CT of chest and Doppler US of his right lower extremity. These were done and revealed bilateral lower lobe and left upper PE; it is also reported that the Doppler of the lower extremity was positive for DVT. He was admitted for further evaluation and management. he subsequently was discharged on Xarelto which she took for about December of 2014 and subsequently stopped on his own. He resumed at about 3 days ago in anticipation of this appointment. Since that time, he has not reported any other thrombosis at that time. He is not reporting any chest pain or shortness of breath or difficulty breathing. He does report some occasional exertional dyspnea. He has reported some neck pain for which she took prednisone which have helped with the symptoms. He has not reported any further lower extremity swelling or edema. Has not reported any weight loss or appetite changes. Has not reported any change in his bowel habits or  hematochezia or melena.    Past Medical History  Diagnosis Date  . Kidney stones   . Pulmonary embolism 05/26/2013  . Right leg DVT 05/26/2013  . Shortness of breath     "@ anytime over the last 5 weeks; probably from the blood clots" (05/26/2013)  :  Past Surgical History  Procedure Laterality Date  . Lithotripsy  1990's  . Knee arthroscopy w/ acl reconstruction Right 1980's  :  Current Outpatient Prescriptions  Medication Sig Dispense Refill  . hydrocortisone cream 1 % Apply 1-3 application topically daily as needed (Skin irritation). Apply to bridge of nose and/or chest      . ibuprofen (ADVIL,MOTRIN) 200 MG tablet Take 400 mg by mouth every 6 (six) hours as needed for pain.      . predniSONE (DELTASONE) 10 MG tablet Take 10-40 mg by mouth daily. Take prednisone for 8 total days.  For the first two days, take 4 tablets each day.  For days 3-4, take 3 tablets each day.  For days 5-6, take 2 tablets each day.  For days 7-8, take 1 tablet each day.      . Rivaroxaban (XARELTO) 15 MG TABS tablet Take 20 mg by mouth 2 (two) times daily with a meal. Follow with her primary care physician one week prior to this medication expires for change in dose       No current facility-administered medications for this visit.       No Known Allergies:   Family history: No history of  any thrombosis or pulmonary emboli.   History   Social History  . Marital Status: Married    Spouse Name: N/A    Number of Children: N/A  . Years of Education: N/A   Occupational History  . Not on file.   Social History Main Topics  . Smoking status: Never Smoker   . Smokeless tobacco: Never Used  . Alcohol Use: Yes     Comment: 05/26/2013 "once/month, maybe a beer"  . Drug Use: No  . Sexual Activity: Yes   Other Topics Concern  . Not on file   Social History Narrative   ** Merged History Encounter **      :  Constitutional: negative for chills, fatigue and fevers Eyes: negative for icterus,  irritation and redness Ears, nose, mouth, throat, and face: negative for epistaxis, nasal congestion and snoring Respiratory: negative for asthma, chronic bronchitis and cough Cardiovascular: negative for chest pain, chest pressure/discomfort and dyspnea Gastrointestinal: negative for abdominal pain, constipation and diarrhea Genitourinary:negative for dysuria, frequency and hematuria Integument/breast: negative for pruritus and rash Hematologic/lymphatic: negative for bleeding, easy bruising and lymphadenopathy Musculoskeletal:negative for arthralgias, back pain and bone pain Neurological: negative for dizziness and paresthesia Behavioral/Psych: negative for anxiety and depression Endocrine: negative for temperature intolerance Allergic/Immunologic: negative for urticaria  Exam: ECOG 0 Blood pressure 125/79, pulse 71, temperature 98.6 F (37 C), temperature source Oral, resp. rate 20, height 5\' 10"  (1.778 m), weight 243 lb 8 oz (110.451 kg). General appearance: alert, cooperative and appears stated age Head: Normocephalic, without obvious abnormality, atraumatic Throat: lips, mucosa, and tongue normal; teeth and gums normal Neck: no adenopathy, no carotid bruit, no JVD, supple, symmetrical, trachea midline and thyroid not enlarged, symmetric, no tenderness/mass/nodules Back: negative, symmetric, no curvature. ROM normal. No CVA tenderness. Resp: clear to auscultation bilaterally Chest wall: no tenderness Cardio: regular rate and rhythm, S1, S2 normal, no murmur, click, rub or gallop GI: soft, non-tender; bowel sounds normal; no masses,  no organomegaly Extremities: extremities normal, atraumatic, no cyanosis or edema Pulses: 2+ and symmetric Skin: Skin color, texture, turgor normal. No rashes or lesions Lymph nodes: Cervical, supraclavicular, and axillary nodes normal. Neurologic: Grossly normal    Assessment and Plan:    63 year old with the following issues:   1. Right calf  and DVT associated with a pulmonary embolism diagnosed in July of 2014. 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 He does not have a family history of thrombosis does not have any personal history of a previous blood clots. I see no evidence to suggest malignancy at this time. He was anticoagulated with Xarelto for about 5 months or so before he stopped the medication on his own. From a management standpoint, I have recommended continuing Xarelto for the time being as well as repeat imaging studies of his chest to make sure that his clot have resolved. I will like to also obtain the blood testing that was obtained at his primary care physician which likely include a hypercoagulable panel if that's the case there will be no need to repeat it. In terms of duration of anticoagulation it will be determined by the results of the laboratory testing and the CT scan findings.  2. Age-appropriate cancer screening his last colonoscopy of was over 10 years ago and I recommended that he pursues that. I doubt that he has intra-abdominal malignancy that is contributing to this blood clots and I will repeat  as mentioned his imaging studies of the chest to make sure there is no underlying lung malignancy.  All his questions are answered and I will see him in about 4 weeks to give my final recommendation.

## 2013-11-30 ENCOUNTER — Telehealth: Payer: Self-pay | Admitting: Oncology

## 2013-11-30 ENCOUNTER — Other Ambulatory Visit: Payer: Self-pay | Admitting: *Deleted

## 2013-11-30 ENCOUNTER — Telehealth: Payer: Self-pay | Admitting: *Deleted

## 2013-11-30 DIAGNOSIS — I2699 Other pulmonary embolism without acute cor pulmonale: Secondary | ICD-10-CM

## 2013-11-30 NOTE — Telephone Encounter (Signed)
lmonvm for pt re appts for 2/3 and 3/5. pt to be given a new schedule 2/3. vm message identified mailbox as belonging to pt

## 2013-11-30 NOTE — Telephone Encounter (Signed)
Called patient with lab appt for  Tomorrow prior to his ct scan. Patient states he had labs drawn at [facility] and would have them fax [place] a copy, unfortunately only coag studies were done and no b-met. Patient scheduled for b-met at 4:00 pm tomorrow prior to his ct scan @ 5:00pm. Patient aware and pof to schedulers. Copy of Coag studies to dr shadad's desk.

## 2013-12-01 ENCOUNTER — Ambulatory Visit (HOSPITAL_COMMUNITY)
Admission: RE | Admit: 2013-12-01 | Discharge: 2013-12-01 | Disposition: A | Discharge status: Home / Self Care | Payer: 59 | Source: Ambulatory Visit | Attending: Oncology | Admitting: Oncology

## 2013-12-01 ENCOUNTER — Encounter (HOSPITAL_COMMUNITY): Payer: Self-pay

## 2013-12-01 ENCOUNTER — Other Ambulatory Visit (HOSPITAL_BASED_OUTPATIENT_CLINIC_OR_DEPARTMENT_OTHER): Payer: Self-pay

## 2013-12-01 DIAGNOSIS — N281 Cyst of kidney, acquired: Secondary | ICD-10-CM | POA: Insufficient documentation

## 2013-12-01 DIAGNOSIS — Z86711 Personal history of pulmonary embolism: Secondary | ICD-10-CM | POA: Insufficient documentation

## 2013-12-01 DIAGNOSIS — I2699 Other pulmonary embolism without acute cor pulmonale: Secondary | ICD-10-CM

## 2013-12-01 DIAGNOSIS — R918 Other nonspecific abnormal finding of lung field: Secondary | ICD-10-CM | POA: Insufficient documentation

## 2013-12-01 DIAGNOSIS — I82409 Acute embolism and thrombosis of unspecified deep veins of unspecified lower extremity: Secondary | ICD-10-CM

## 2013-12-01 LAB — BASIC METABOLIC PANEL (CC13)
Anion Gap: 10 mEq/L (ref 3–11)
BUN: 19.3 mg/dL (ref 7.0–26.0)
CHLORIDE: 107 meq/L (ref 98–109)
CO2: 21 meq/L — AB (ref 22–29)
Calcium: 9.2 mg/dL (ref 8.4–10.4)
Creatinine: 1 mg/dL (ref 0.7–1.3)
GLUCOSE: 90 mg/dL (ref 70–140)
Potassium: 4.2 mEq/L (ref 3.5–5.1)
SODIUM: 139 meq/L (ref 136–145)

## 2013-12-01 MED ORDER — IOHEXOL 300 MG/ML  SOLN
100.0000 mL | Freq: Once | INTRAMUSCULAR | Status: AC | PRN
  Administered 2013-12-01: 100 mL via INTRAVENOUS

## 2013-12-02 ENCOUNTER — Telehealth: Payer: Self-pay | Admitting: *Deleted

## 2013-12-02 NOTE — Telephone Encounter (Signed)
Message copied by Randolm Idol on Wed Dec 02, 2013  2:11 PM ------      Message from: Juan [name]      Created: Wed Dec 02, 2013 10:07 AM       Please call his scan results: normal no clots. ------

## 2013-12-02 NOTE — Telephone Encounter (Signed)
Message copied by Randolm Idol on Wed Dec 02, 2013  3:24 PM ------      Message from: Wyatt Portela      Created: Wed Dec 02, 2013 10:07 AM       Please call his scan results: normal no clots. ------

## 2013-12-02 NOTE — Telephone Encounter (Signed)
Spoke with patient's wife, sandra. Per dr [name], CT scan of the chest  looks good. No clots.

## 2013-12-29 ENCOUNTER — Other Ambulatory Visit: Payer: Self-pay

## 2013-12-31 ENCOUNTER — Encounter: Payer: Self-pay | Admitting: Oncology

## 2013-12-31 ENCOUNTER — Ambulatory Visit (HOSPITAL_BASED_OUTPATIENT_CLINIC_OR_DEPARTMENT_OTHER): Payer: Self-pay | Admitting: Oncology

## 2013-12-31 VITALS — BP 114/79 | HR 63 | Temp 97.4°F | Resp 18 | Ht 70.0 in | Wt 238.5 lb

## 2013-12-31 DIAGNOSIS — I82409 Acute embolism and thrombosis of unspecified deep veins of unspecified lower extremity: Secondary | ICD-10-CM

## 2013-12-31 DIAGNOSIS — Z7982 Long term (current) use of aspirin: Secondary | ICD-10-CM

## 2013-12-31 DIAGNOSIS — I2699 Other pulmonary embolism without acute cor pulmonale: Secondary | ICD-10-CM

## 2013-12-31 NOTE — Progress Notes (Signed)
Hematology and Oncology Follow Up Visit  Juan [name] [id number] [birth year] 63 y.o. 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   Prior Therapy: He completed a course of anticoagulation with Xarelto which she discontinued in February of 2015.  Interim History:  Juan [name] presents today for a followup visit. He is a gentleman I saw him consultation back on 11/27/2013 with the above diagnosis. Since the last time I saw him, he has been doing fairly well. He has not reported any recent illnesses or hospitalizations. Has not reported any thrombosis or bleeding. He stopped a Xarelto after her he has completed about 6 months of anticoagulation. He was also diagnosed with shingles and is recovering from it..  Medications: I have reviewed the patient's current medications.  Current Outpatient Prescriptions  Medication Sig Dispense Refill  . hydrocortisone cream 1 % Apply 1-3 application topically daily as needed (Skin irritation). Apply to bridge of nose and/or chest      . ibuprofen (ADVIL,MOTRIN) 200 MG tablet Take 400 mg by mouth every 6 (six) hours as needed for pain.       No current facility-administered medications for this visit.     Allergies: No Known Allergies  Past Medical History, Surgical history, Social history, and Family History were reviewed and updated.  Review of Systems: Constitutional:  Negative for fever, chills, night sweats, anorexia, weight loss, pain. Cardiovascular: no chest pain or dyspnea on exertion Respiratory: no cough, shortness of breath, or wheezing Neurological: no TIA or stroke symptoms Dermatological: negative ENT: negative Skin: Negative. Gastrointestinal: no abdominal pain, change in bowel habits, or black or bloody stools Genito-Urinary: no dysuria, trouble voiding, or hematuria Hematological and Lymphatic:  negative Breast: negative Musculoskeletal: negative Remaining ROS negative. Physical Exam: Blood pressure 114/79, pulse 63, temperature 97.4 F (36.3 C), temperature source Oral, resp. rate 18, height 5\' 10"  (1.778 m), weight 238 lb 8 oz (108.183 kg), SpO2 100.00%. ECOG:  General appearance: alert, cooperative and appears stated age Head: Normocephalic, without obvious abnormality, atraumatic Neck: no adenopathy, no carotid bruit, no JVD, supple, symmetrical, trachea midline and thyroid not enlarged, symmetric, no tenderness/mass/nodules Lymph nodes: Cervical, supraclavicular, and axillary nodes normal. Heart:regular rate and rhythm, S1, S2 normal, no murmur, click, rub or gallop Lung:chest clear, no wheezing, rales, normal symmetric air entry Abdomin: soft, non-tender, without masses or organomegaly EXT:no erythema, induration, or nodules   Lab Results: Lab Results  Component Value Date   WBC 9.2 05/28/2013   HGB 14.1 05/28/2013   HCT 40.9 05/28/2013   MCV 88.3 05/28/2013   PLT 180 05/28/2013     Chemistry      Component Value Date/Time   NA 139 12/01/2013 1559   NA 139 05/27/2013 0530   K 4.2 12/01/2013 1559   K 3.7 05/27/2013 0530   CL 105 05/27/2013 0530   CO2 21* 12/01/2013 1559   CO2 25 05/27/2013 0530   BUN 19.3 12/01/2013 1559   BUN 24* 05/27/2013 0530   CREATININE 1.0 12/01/2013 1559   CREATININE 1.09 05/27/2013 0530      Component Value Date/Time   CALCIUM 9.2 12/01/2013 1559   CALCIUM 8.5 05/27/2013 0530   ALKPHOS 143* 05/26/2013 1425   AST 39* 05/26/2013 1425   ALT 72* 05/26/2013 1425   BILITOT 0.8 05/26/2013 1425       Radiological Studies: EXAM:  CT CHEST WITH CONTRAST  TECHNIQUE:  Multidetector CT imaging of the chest was performed during  intravenous contrast administration.  CONTRAST: 162mL OMNIPAQUE IOHEXOL 300 MG/ML SOLN  COMPARISON: 05/26/2013  FINDINGS:  Study was not ordered or performed as a dedicated CTA. No evidence  of central/lobar pulmonary embolism on  this routine enhanced CT.  Two clustered nodules measuring up to 7 mm in the anterior right  upper lobe (series 5/ image 17), unchanged.  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 Mild dependent atelectasis in the posterior right  lower lobe. No pleural effusion or pneumothorax.  Visualized thyroid is unremarkable.  Heart is normal in size. No pericardial effusion.  1.9 cm short axis right hilar node (series 2/ image 23), unchanged.  No suspicious mediastinal or axillary lymphadenopathy.  Visualized upper abdomen is notable for a 3.6 cm lateral left renal  cyst (series 2/ image 62).  IMPRESSION:  Dedicated CTA was not ordered/performed. No evidence of  central/lobar pulmonary embolism on routine enhanced CT.  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  Two right upper lobe pulmonary nodules measuring up to 7 mm, grossly  unchanged. 6 month stability has been demonstrated. Follow-up CT  chest is suggested in 6 months.  1.9 cm right hilar node (series 2/ image 23), of uncertain etiology,  unchanged. Attention on follow-up is suggested.  This recommendation follows the consensus statement: Guidelines for  Management of Small Pulmonary Nodules Detected on CT Scans: A  Statement from the Centre as published in Radiology  2005; 237:395-400.     Impression and Plan:  63 year old gentleman with the following issues:  1. DVT associated with a pulmonary embolism diagnosed in July of 2015. He completed 6 months of anticoagulation without any complication. His risk factors included a mobility but a negative thrombophilia panel. I've given him the option of continuing Xarelto versus stopping and using low-dose aspirin and he elected to proceed with aspirin. I've also started him to avoid situations that increases his thrombosis risk. I counseled him today about life style changes including losing weight and  exercise. I've also counseled him about moving especially in long car ride and flights.  2. 2 right upper lobe pulmonary nodules unchanged and unlikely to be of any malignant nature.  I will be happy to see Mr. Debruhl as needed.  [facility], MD 3/5/201512:20 PM

## 2014-07-13 IMAGING — CR DG CHEST 2V
2 series · 2 of 2 positions shown · non-contrast
Comparison: None.

CLINICAL DATA: Leukocytosis

CHEST - 2 VIEW

[w chest pa]
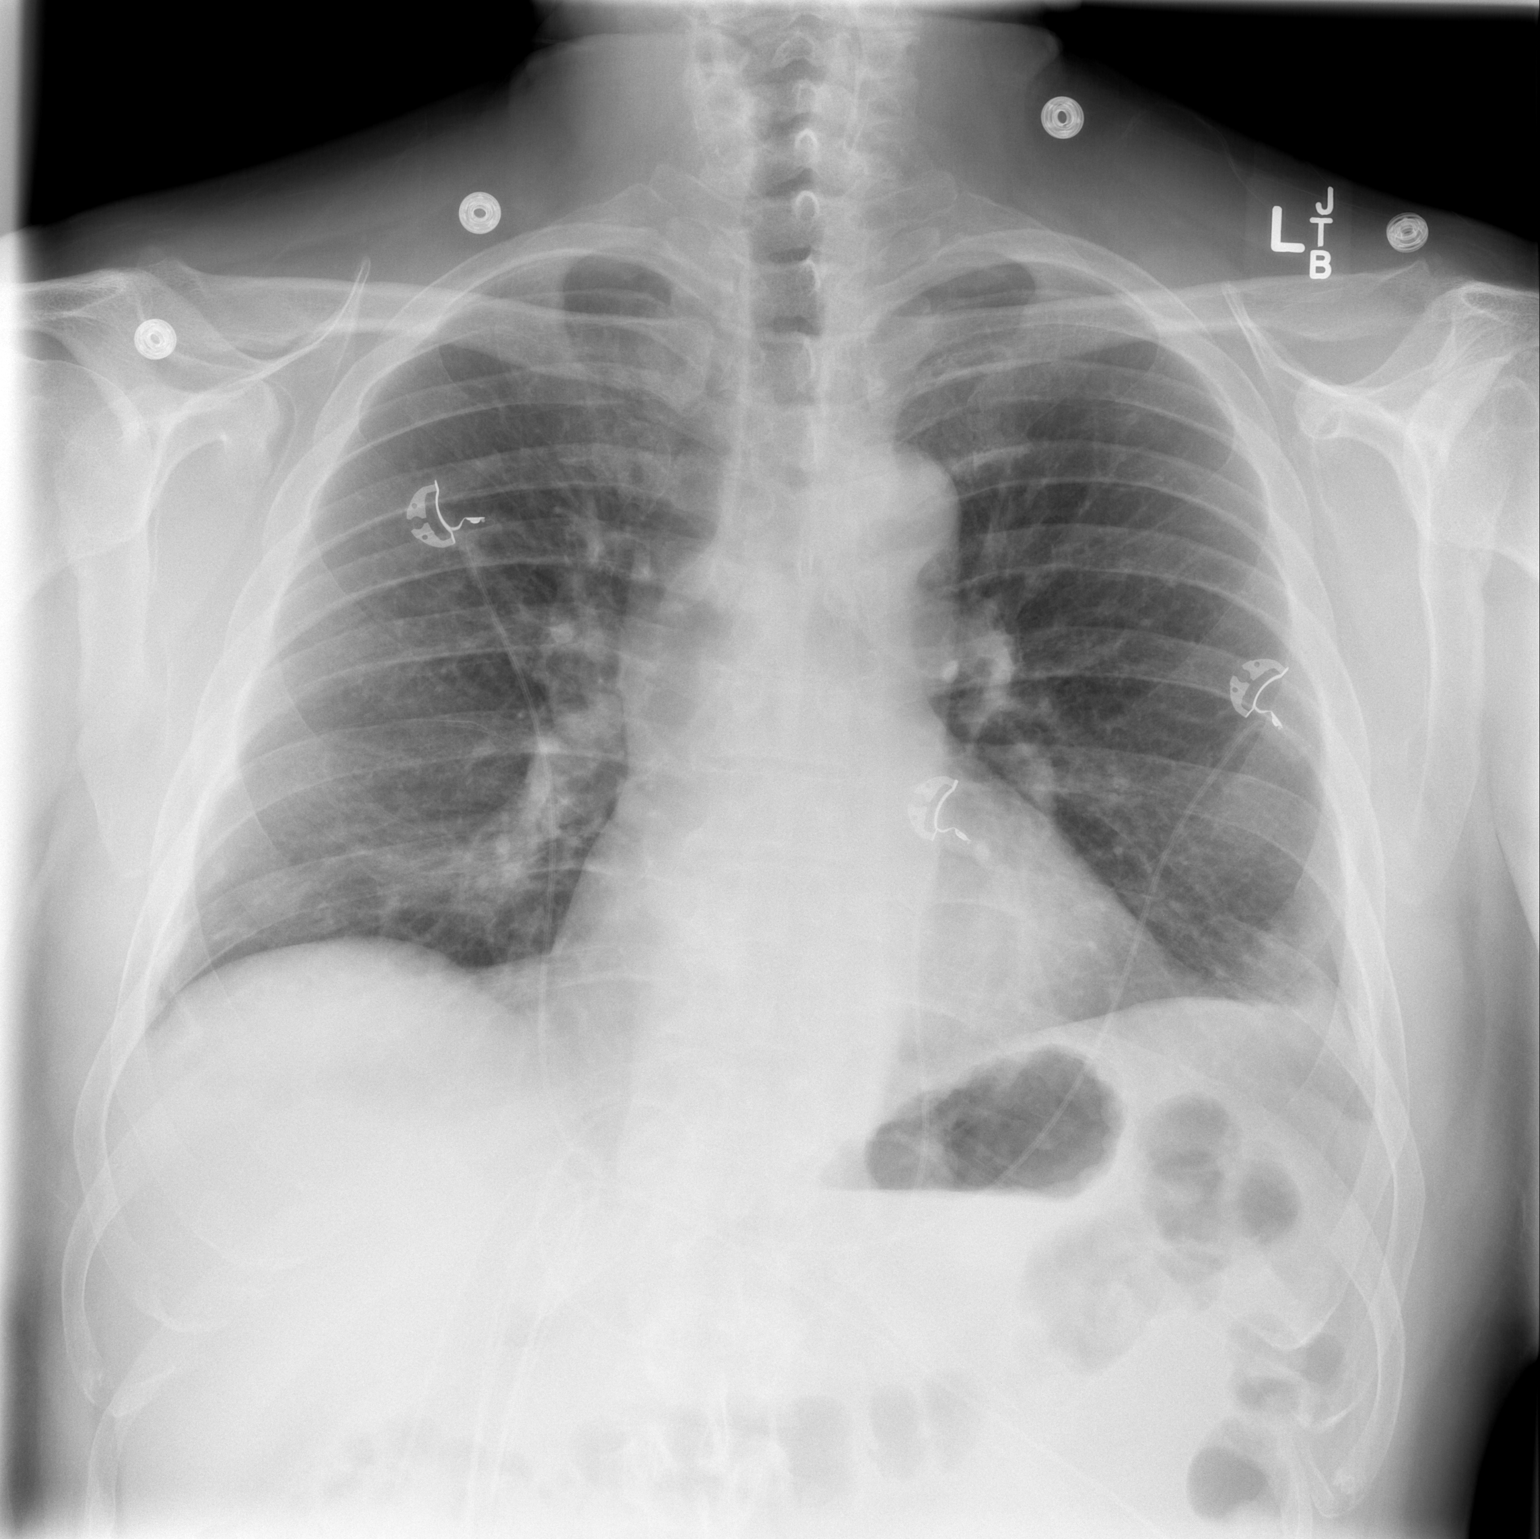

[w chest lat]
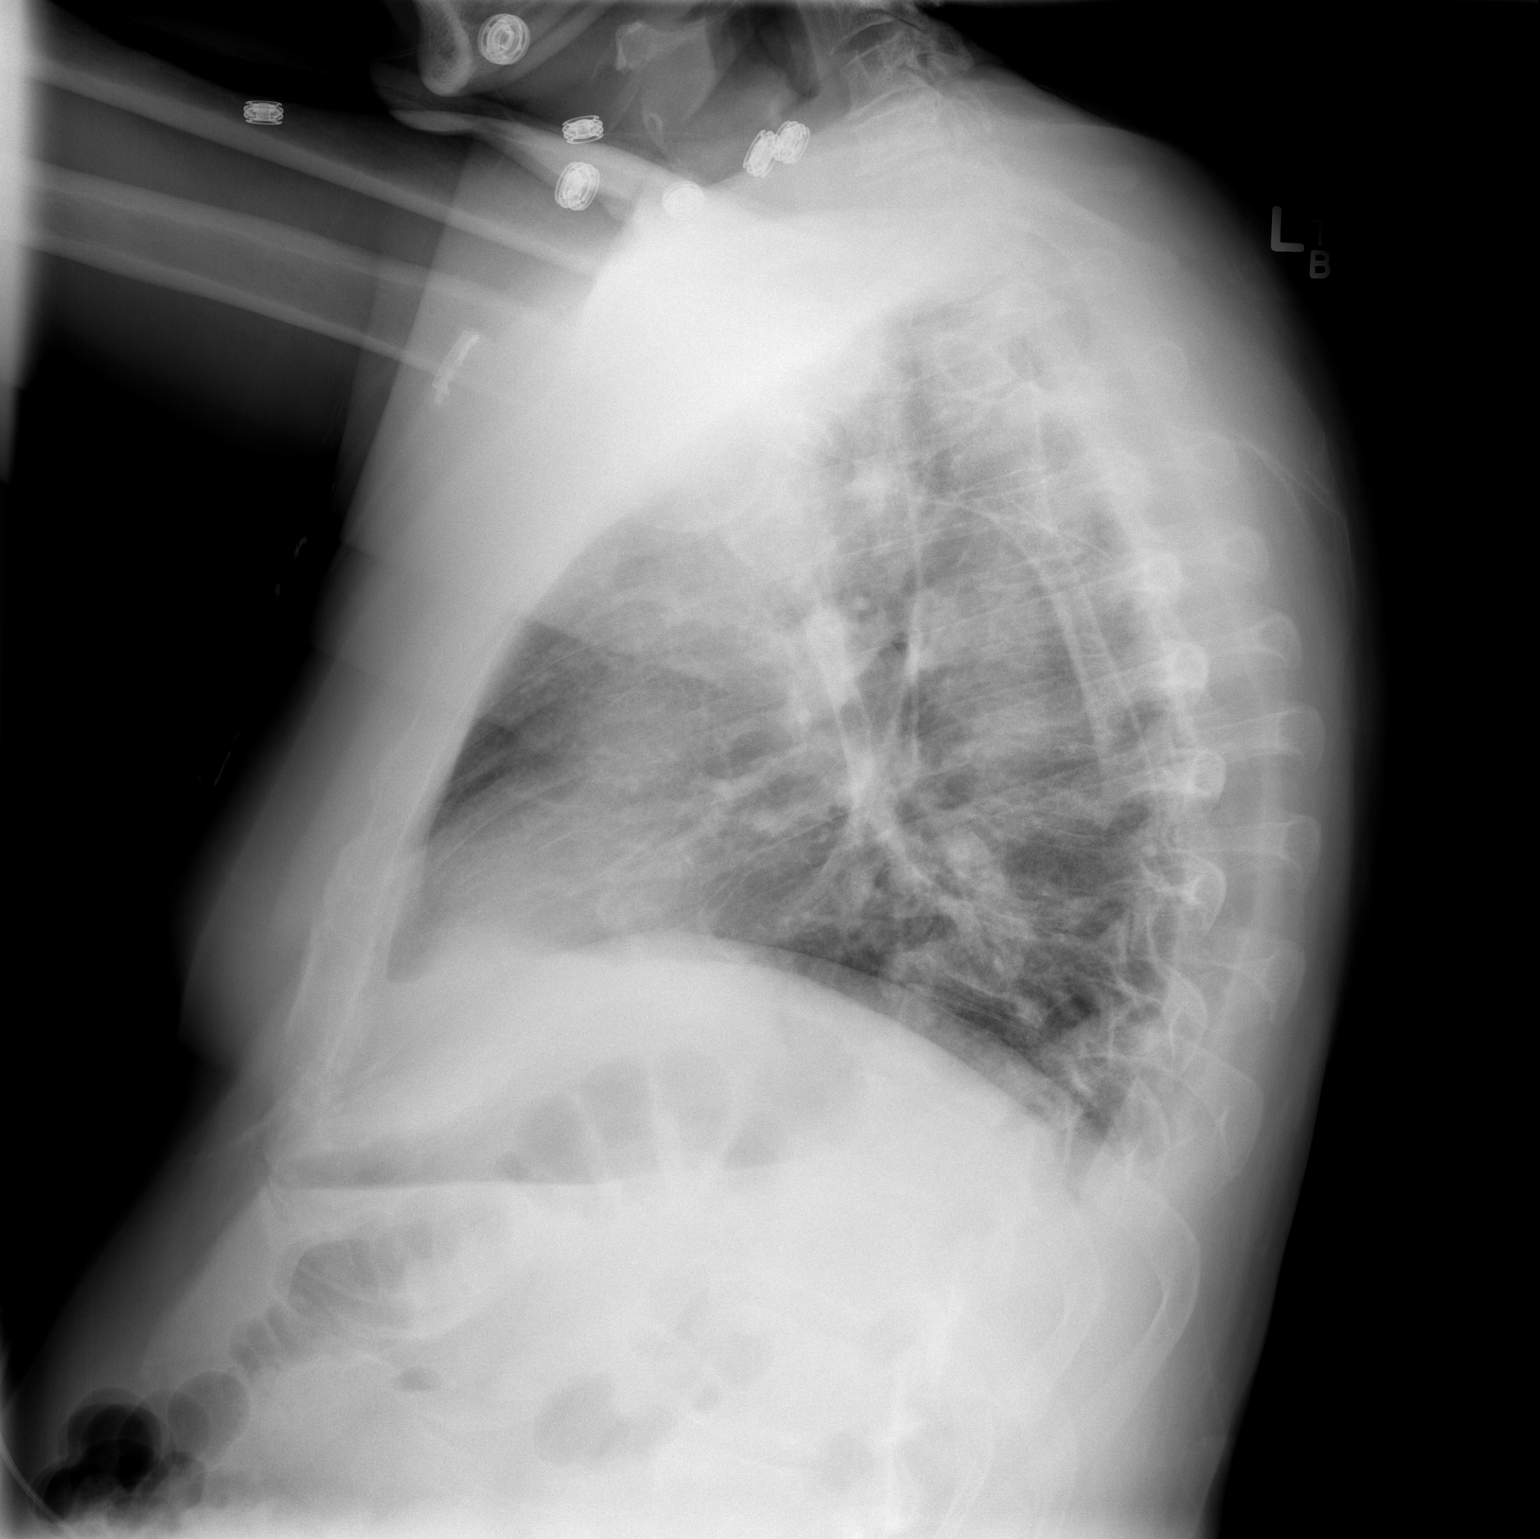

[2 of 2 positions shown; findings below may reference images not displayed]

FINDINGS: Cardiomediastinal silhouette appears normal.  No acute
pulmonary disease is noted.  Bony thorax is intact.
IMPRESSION: No acute cardiopulmonary abnormality seen.

## 2014-08-13 ENCOUNTER — Other Ambulatory Visit: Payer: Self-pay

## 2015-01-17 IMAGING — CT CT CHEST W/ CM
2 of 3 series · 15 of 36 positions shown, 18 images · IV contrast (OMNIPAQUE)
Comparison: 05/26/2013

CLINICAL DATA: History of PE, follow-up. No current shortness of
breath or chest pain.

EXAM:
CT CHEST WITH CONTRAST
TECHNIQUE: Multidetector CT imaging of the chest was performed during
intravenous contrast administration.
CONTRAST:  100mL OMNIPAQUE IOHEXOL 300 MG/ML  SOLN

[Series 2: chest with st · axial · 0.90mm/px · z∈[-292,-32]mm · 12 of 62 slices shown, 15 images]
[im 5/62  mediastinal]
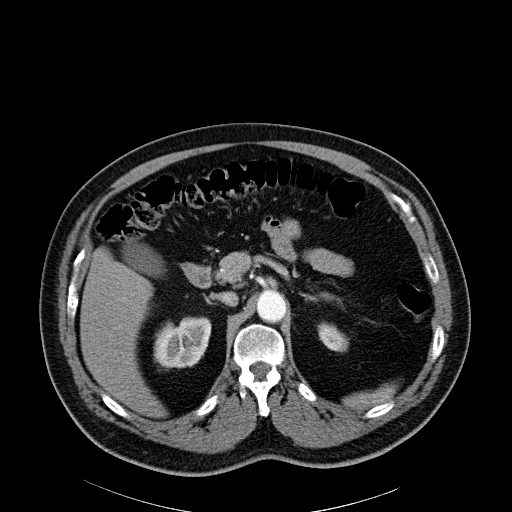
[im 5/62  lung]
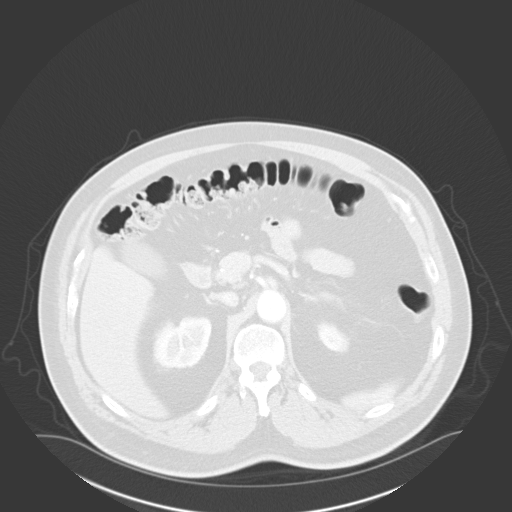
[im 10/62  lung]
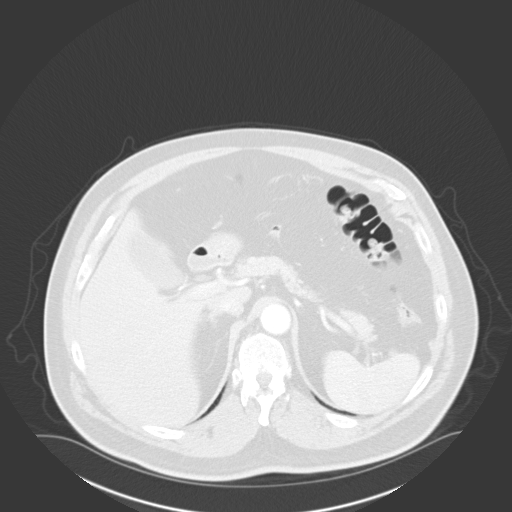
[im 14/62  lung]
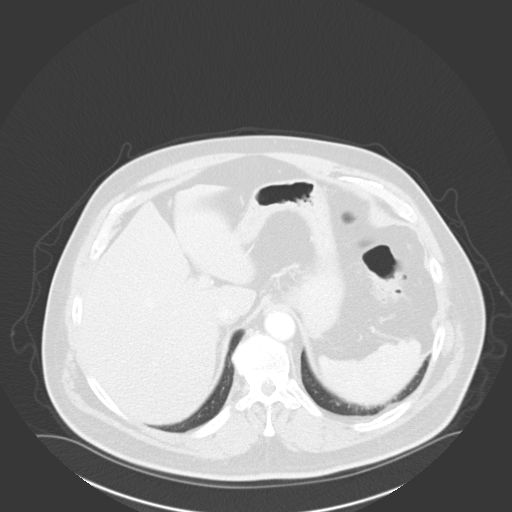
[im 19/62  lung]
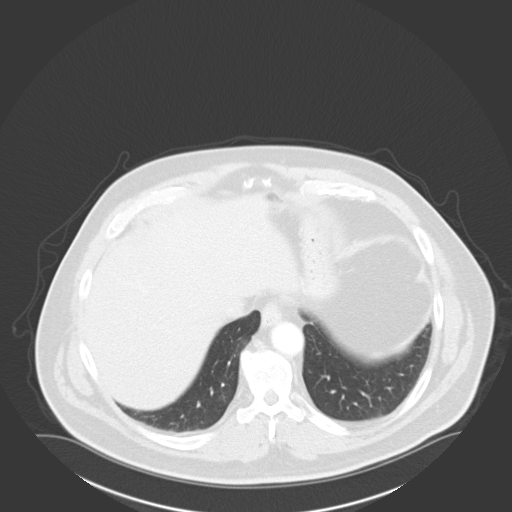
[im 23/62  mediastinal]
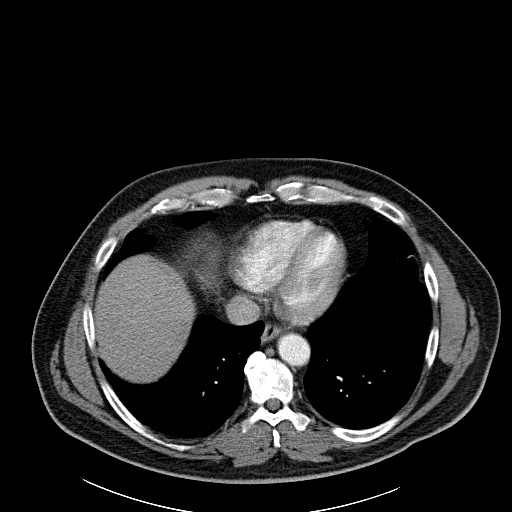
[im 23/62  lung]
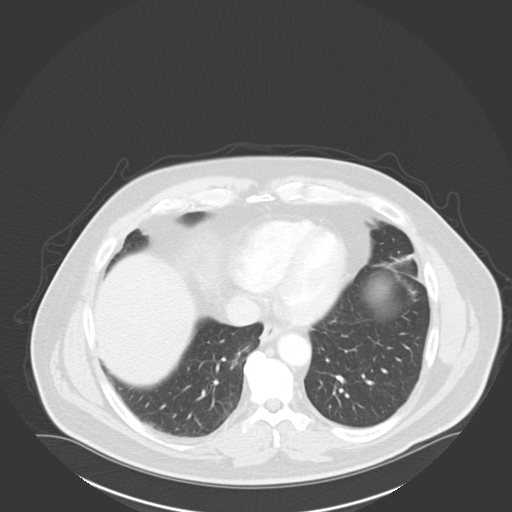
[im 28/62  lung]
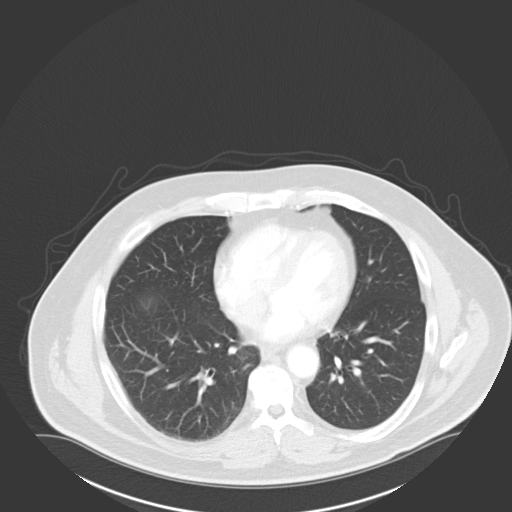
[im 34/62  lung]
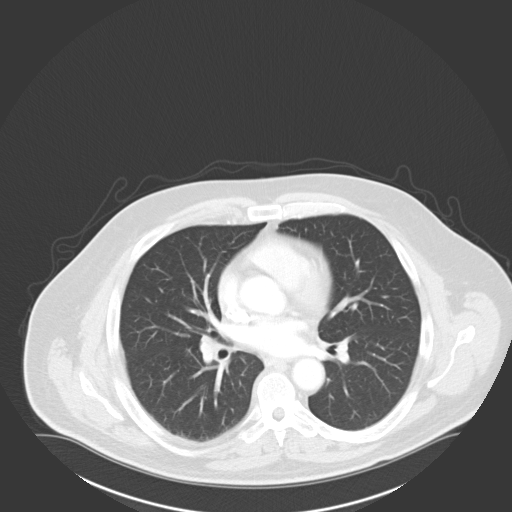
[im 39/62  lung]
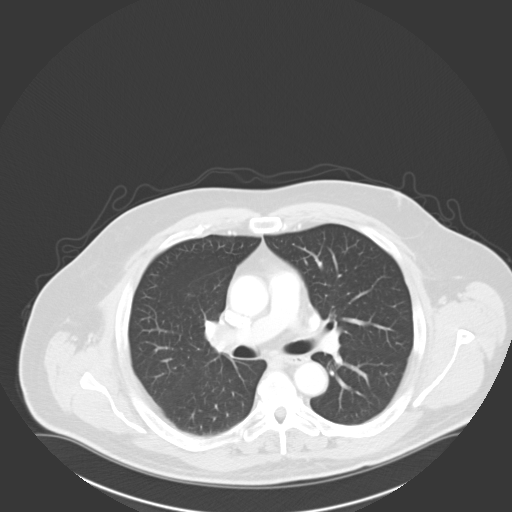
[im 43/62  mediastinal]
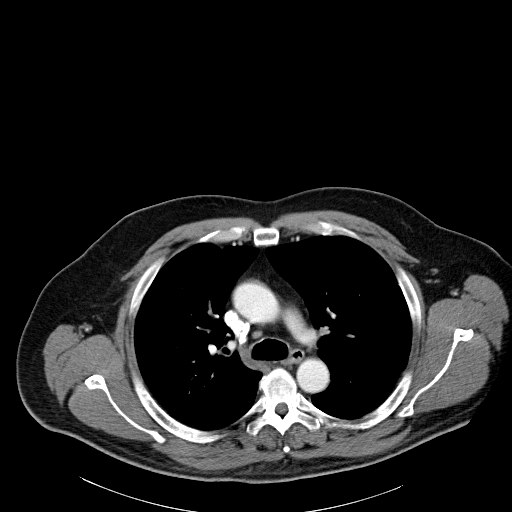
[im 43/62  lung]
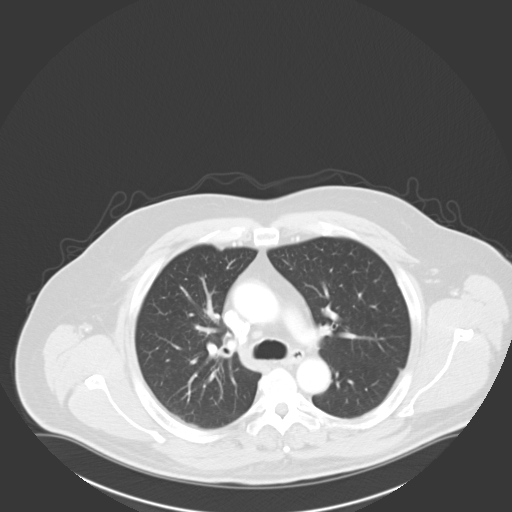
[im 48/62  lung]
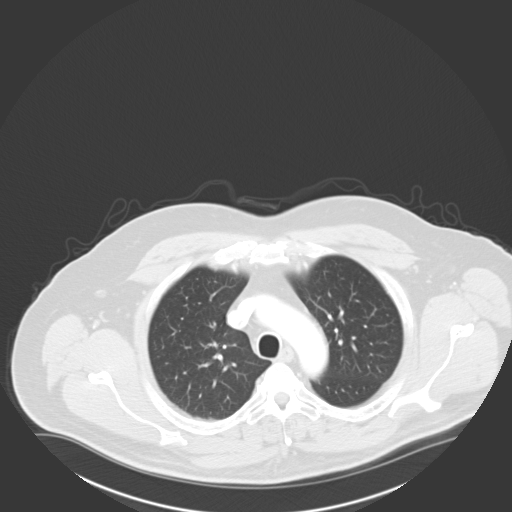
[im 52/62  lung]
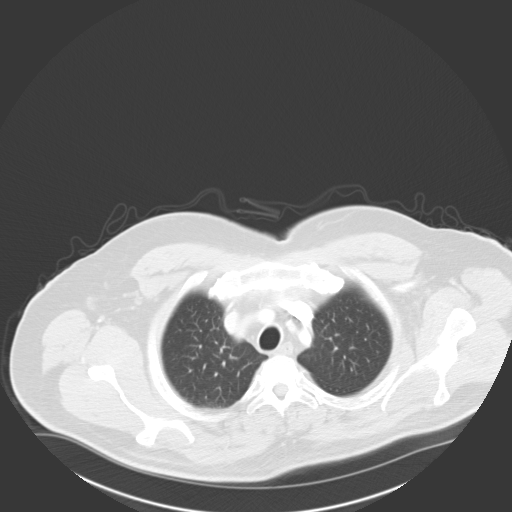
[im 57/62  lung]
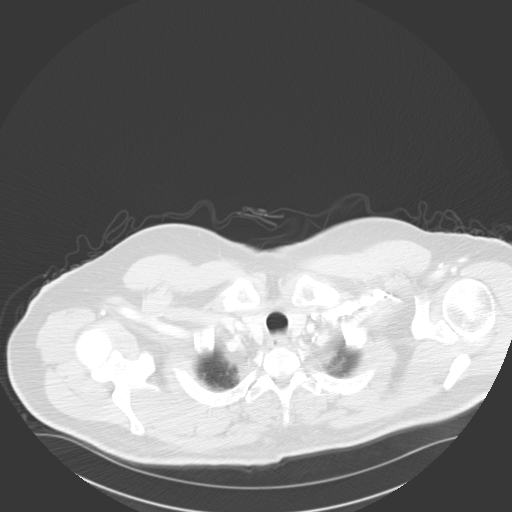

[Series 602: cor · coronal · 0.90mm/px · 3 of 92 slices shown]
[im 19/92  lung]
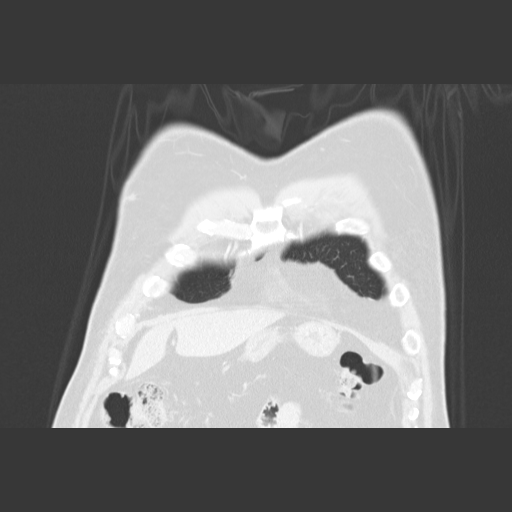
[im 37/92  lung]
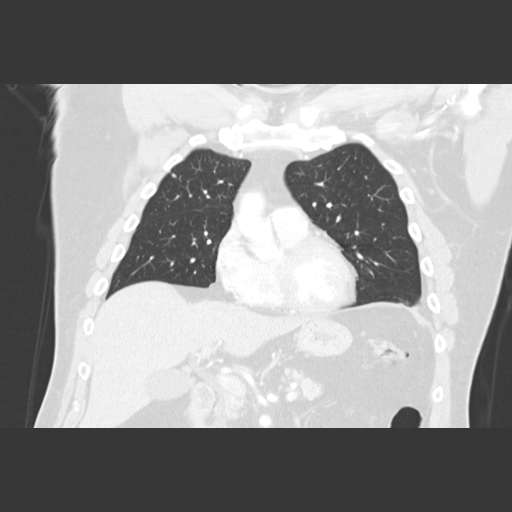
[im 55/92  lung]
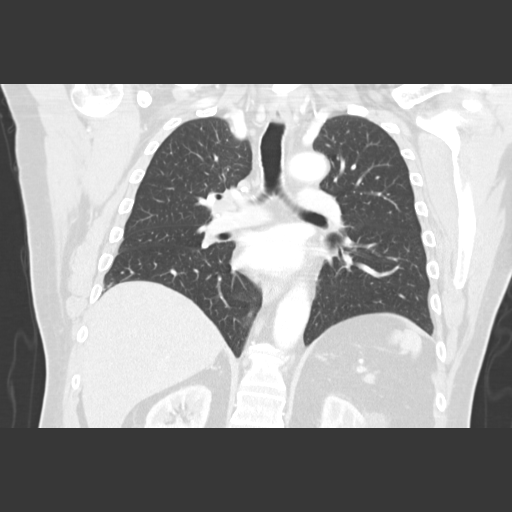

[15 of 36 positions shown; findings below may reference images not displayed]

FINDINGS: Study was not ordered or performed as a dedicated CTA. No evidence
of central/lobar pulmonary embolism on this routine enhanced CT.

Two clustered nodules measuring up to 7 mm in the anterior right
upper lobe (series 5/ image 17), unchanged.

Mild lingular scarring (series 5/ image 41), likely sequela of prior
pulmonary infarct. Mild dependent atelectasis in the posterior right
lower lobe. No pleural effusion or pneumothorax.

Visualized thyroid is unremarkable.

Heart is normal in size.  No pericardial effusion.

1.9 cm short axis right hilar node (series 2/ image 23), unchanged.
No suspicious mediastinal or axillary lymphadenopathy.

Visualized upper abdomen is notable for a 3.6 cm lateral left renal
cyst (series 2/ image 62).
IMPRESSION: Dedicated CTA was not ordered/performed. No evidence of
central/lobar pulmonary embolism on routine enhanced CT.

Mild lingular scarring, likely sequela of prior pulmonary infarct.

Two right upper lobe pulmonary nodules measuring up to 7 mm, grossly
unchanged. 6 month stability has been demonstrated. Follow-up CT
chest is suggested in 6 months.

1.9 cm right hilar node (series 2/ image 23), of uncertain etiology,
unchanged. Attention on follow-up is suggested.

This recommendation follows the consensus statement: Guidelines for
Management of Small Pulmonary Nodules Detected on CT Scans: A
Statement from the [HOSPITAL] as published in Radiology

## 2015-09-30 ENCOUNTER — Emergency Department (HOSPITAL_COMMUNITY): Payer: Self-pay

## 2015-09-30 ENCOUNTER — Observation Stay (HOSPITAL_COMMUNITY)
Admission: EM | Admit: 2015-09-30 | Discharge: 2015-10-01 | Disposition: A | Discharge status: Home / Self Care | Payer: Self-pay | Attending: Internal Medicine | Admitting: Internal Medicine

## 2015-09-30 ENCOUNTER — Emergency Department (HOSPITAL_BASED_OUTPATIENT_CLINIC_OR_DEPARTMENT_OTHER): Payer: Self-pay

## 2015-09-30 ENCOUNTER — Encounter (HOSPITAL_COMMUNITY): Payer: Self-pay | Admitting: Emergency Medicine

## 2015-09-30 DIAGNOSIS — M79609 Pain in unspecified limb: Secondary | ICD-10-CM

## 2015-09-30 DIAGNOSIS — R079 Chest pain, unspecified: Secondary | ICD-10-CM | POA: Insufficient documentation

## 2015-09-30 DIAGNOSIS — M7989 Other specified soft tissue disorders: Secondary | ICD-10-CM

## 2015-09-30 DIAGNOSIS — I82409 Acute embolism and thrombosis of unspecified deep veins of unspecified lower extremity: Secondary | ICD-10-CM

## 2015-09-30 DIAGNOSIS — I82401 Acute embolism and thrombosis of unspecified deep veins of right lower extremity: Principal | ICD-10-CM | POA: Diagnosis present

## 2015-09-30 DIAGNOSIS — N200 Calculus of kidney: Secondary | ICD-10-CM | POA: Insufficient documentation

## 2015-09-30 DIAGNOSIS — Z86711 Personal history of pulmonary embolism: Secondary | ICD-10-CM | POA: Insufficient documentation

## 2015-09-30 DIAGNOSIS — I2699 Other pulmonary embolism without acute cor pulmonale: Secondary | ICD-10-CM | POA: Diagnosis present

## 2015-09-30 LAB — CBC WITH DIFFERENTIAL/PLATELET
Basophils Absolute: 0 10*3/uL (ref 0.0–0.1)
Basophils Relative: 0 %
Eosinophils Absolute: 0.1 10*3/uL (ref 0.0–0.7)
Eosinophils Relative: 1 %
HEMATOCRIT: 42.1 % (ref 39.0–52.0)
HEMOGLOBIN: 14 g/dL (ref 13.0–17.0)
LYMPHS PCT: 32 %
Lymphs Abs: 3 10*3/uL (ref 0.7–4.0)
MCH: 29.6 pg (ref 26.0–34.0)
MCHC: 33.3 g/dL (ref 30.0–36.0)
MCV: 89 fL (ref 78.0–100.0)
MONO ABS: 0.6 10*3/uL (ref 0.1–1.0)
Monocytes Relative: 7 %
NEUTROS ABS: 5.6 10*3/uL (ref 1.7–7.7)
Neutrophils Relative %: 60 %
Platelets: 159 10*3/uL (ref 150–400)
RBC: 4.73 MIL/uL (ref 4.22–5.81)
RDW: 13.1 % (ref 11.5–15.5)
WBC: 9.4 10*3/uL (ref 4.0–10.5)

## 2015-09-30 LAB — I-STAT CHEM 8, ED
BUN: 14 mg/dL (ref 6–20)
Calcium, Ion: 1.19 mmol/L (ref 1.13–1.30)
Chloride: 103 mmol/L (ref 101–111)
Creatinine, Ser: 1.1 mg/dL (ref 0.61–1.24)
Glucose, Bld: 83 mg/dL (ref 65–99)
HCT: 44 % (ref 39.0–52.0)
Hemoglobin: 15 g/dL (ref 13.0–17.0)
Potassium: 4.2 mmol/L (ref 3.5–5.1)
Sodium: 140 mmol/L (ref 135–145)
TCO2: 27 mmol/L (ref 0–100)

## 2015-09-30 LAB — PROTIME-INR
INR: 1.13 (ref 0.00–1.49)
Prothrombin Time: 14.7 seconds (ref 11.6–15.2)

## 2015-09-30 LAB — APTT: aPTT: 34 seconds (ref 24–37)

## 2015-09-30 MED ORDER — IOHEXOL 350 MG/ML SOLN
100.0000 mL | Freq: Once | INTRAVENOUS | Status: AC | PRN
  Administered 2015-09-30: 100 mL via INTRAVENOUS

## 2015-09-30 MED ORDER — ACETAMINOPHEN 325 MG PO TABS: 650.0000 mg | ORAL_TABLET | Freq: Four times a day (QID) | ORAL | Status: DC | PRN

## 2015-09-30 MED ORDER — ENOXAPARIN SODIUM 120 MG/0.8ML ~~LOC~~ SOLN
1.0000 mg/kg | Freq: Two times a day (BID) | SUBCUTANEOUS | Status: DC
  Administered 2015-09-30: 105 mg via SUBCUTANEOUS
  Filled 2015-09-30 (×3): qty 0.8

## 2015-09-30 MED ORDER — SODIUM CHLORIDE 0.9 % IV SOLN: INTRAVENOUS | Status: DC

## 2015-09-30 MED ORDER — SODIUM CHLORIDE 0.9 % IJ SOLN
3.0000 mL | Freq: Two times a day (BID) | INTRAMUSCULAR | Status: DC
  Administered 2015-09-30: 3 mL via INTRAVENOUS

## 2015-09-30 MED ORDER — PATIENT'S GUIDE TO USING COUMADIN BOOK
Freq: Once | Status: AC
  Administered 2015-09-30
  Filled 2015-09-30: qty 1

## 2015-09-30 MED ORDER — ONDANSETRON HCL 4 MG PO TABS: 4.0000 mg | ORAL_TABLET | Freq: Four times a day (QID) | ORAL | Status: DC | PRN

## 2015-09-30 MED ORDER — WARFARIN - PHARMACIST DOSING INPATIENT: Freq: Every day | Status: DC

## 2015-09-30 MED ORDER — ONDANSETRON HCL 4 MG/2ML IJ SOLN: 4.0000 mg | Freq: Four times a day (QID) | INTRAMUSCULAR | Status: DC | PRN

## 2015-09-30 MED ORDER — ACETAMINOPHEN 650 MG RE SUPP: 650.0000 mg | Freq: Four times a day (QID) | RECTAL | Status: DC | PRN

## 2015-09-30 MED ORDER — WARFARIN SODIUM 7.5 MG PO TABS
7.5000 mg | ORAL_TABLET | Freq: Once | ORAL | Status: AC
  Administered 2015-09-30: 7.5 mg via ORAL
  Filled 2015-09-30: qty 1

## 2015-09-30 NOTE — H&P (Signed)
PCP:   MYERS,BRITTANY, PA-C   Chief Complaint:  Leg pain and shortness of breath  HPI: 64 year old male who   has a past medical history of Kidney stones; Pulmonary embolism ([place]) (05/26/2013); Right leg DVT ([place]) (05/26/2013); and Shortness of breath. Today patient came to the hospital with ongoing leg pain for 3 weeks and development of shortness of breath of one day duration. Patient says that he has a history of DVT and was diagnosed in 2014 at that time he was prescribed Xarelto which he took for 6 months and he stopped anticoagulation on January 2015. He did not have any symptoms after that. As per patient in October of this year he traveled a lot, did go to [place] by car and also traveled to [place]. 2 weeks ago patient developed right knee swelling and was seen by orthopedic surgery. The right knee effusion was drained in the office. Patient denies any chest pain, no nausea vomiting or diarrhea. In the ED lower extremity venous duplex showed right popliteal vein DVT, CT angiogram also showed pulmonary emboli involving the right lower lobe.  Allergies:  No Known Allergies    Past Medical History  Diagnosis Date  . Kidney stones   . Pulmonary embolism ([place]) 05/26/2013  . Right leg DVT ([place]) 05/26/2013  . Shortness of breath     "@ anytime over the last 5 weeks; probably from the blood clots" (05/26/2013)    Past Surgical History  Procedure Laterality Date  . Lithotripsy  1990's  . Knee arthroscopy w/ acl reconstruction Right 1980's    Prior to Admission medications   Medication Sig Start Date End Date Taking? Authorizing Provider  aspirin 81 MG tablet Take 81 mg by mouth daily.   Yes Historical Provider, MD    Social History:  reports that he has never smoked. He has never used smokeless tobacco. He reports that he drinks alcohol. He reports that he does not use illicit drugs.  No family history on file.  Filed Weights   09/30/15 1038  Weight: 106.595 kg (235 lb)     All the positives are listed in BOLD  Review of Systems:  HEENT: Headache, blurred vision, runny nose, sore throat Neck: Hypothyroidism, hyperthyroidism,,lymphadenopathy Chest : Shortness of breath, history of COPD, Asthma Heart : Chest pain, history of coronary arterey disease GI:  Nausea, vomiting, diarrhea, constipation, GERD GU: Dysuria, urgency, frequency of urination, hematuria Neuro: Stroke, seizures, syncope Psych: Depression, anxiety, hallucinations   Physical Exam: Blood pressure 123/77, pulse 74, temperature 98.2 F (36.8 C), temperature source Oral, resp. rate 24, height 5' 10.5" (1.791 m), weight 106.595 kg (235 lb), SpO2 100 %. Constitutional:   Patient is a well-developed and well-nourished male* in no acute distress and cooperative with exam. Head: Normocephalic and atraumatic Mouth: Mucus membranes moist Eyes: PERRL, EOMI, conjunctivae normal Neck: Supple, No Thyromegaly Cardiovascular: RRR, S1 normal, S2 normal Pulmonary/Chest: CTAB, no wheezes, rales, or rhonchi Abdominal: Soft. Non-tender, non-distended, bowel sounds are normal, no masses, organomegaly, or guarding present.  Neurological: A&O x3, Strength is normal and symmetric bilaterally, cranial nerve II-XII are grossly intact, no focal motor deficit, sensory intact to light touch bilaterally.  Extremities : No Cyanosis, Clubbing or Edema  Labs on Admission:  Basic Metabolic Panel:  Recent Labs Lab 09/30/15 1443  NA 140  K 4.2  CL 103  GLUCOSE 83  BUN 14  CREATININE 1.10   Liver Function Tests: No results for input(s): AST, ALT, ALKPHOS, BILITOT, PROT, ALBUMIN in the  last 168 hours. No results for input(s): LIPASE, AMYLASE in the last 168 hours. No results for input(s): AMMONIA in the last 168 hours. CBC:  Recent Labs Lab 09/30/15 1443 09/30/15 1728  WBC  --  9.4  NEUTROABS  --  5.6  HGB 15.0 14.0  HCT 44.0 42.1  MCV  --  89.0  PLT  --  159    Radiological Exams on Admission: Ct  Angio Chest Pe W/cm &/or Wo Cm  09/30/2015  CLINICAL DATA:  Recent diagnosis of deep venous thrombosis. Chest pain. EXAM: CT ANGIOGRAPHY CHEST WITH CONTRAST TECHNIQUE: Multidetector CT imaging of the chest was performed using the standard protocol during bolus administration of intravenous contrast. Multiplanar CT image reconstructions and MIPs were obtained to evaluate the vascular anatomy. CONTRAST:  175mL OMNIPAQUE IOHEXOL 350 MG/ML SOLN COMPARISON:  None. FINDINGS: There is adequate opacification of the pulmonary arteries. There are pulmonary emboli in the right lower lobe segmental pulmonary artery branches. The main pulmonary artery, right main pulmonary artery and left main pulmonary arteries are normal in size. The heart size is normal. There is no pericardial effusion. There is right basilar and right middle lobe atelectasis. There is mild lingular atelectasis. There is a small right pleural effusion. There is no pneumothorax. There are 2 right upper lobe pulmonary nodules measuring 6 mm and 5 mm respectively abutting each other similar in appearance to the prior exam of 12/01/2013. There is no axillary, hilar, or mediastinal adenopathy. There is no lytic or blastic osseous lesion. The visualized portions of the upper abdomen are unremarkable. Review of the MIP images confirms the above findings. IMPRESSION: 1. Pulmonary emboli involving the right lower lobe. No evidence of heart strain. 2. There are 2 right upper lobe pulmonary nodules measuring 6 mm and 5 mm respectively abutting each other similar in appearance to the prior exam of 12/01/2013. If the patient is at high risk for bronchogenic carcinoma, follow-up chest CT at 6-12 months is recommended. If the patient is at low risk for bronchogenic carcinoma, follow-up chest CT at 12 months is recommended. This recommendation follows the consensus statement: Guidelines for Management of Small Pulmonary Nodules Detected on CT Scans: A Statement from the  [facility] as published in Radiology 2005;237:395-400. Critical Value/emergent results were called by telephone at the time of interpretation on 09/30/2015 at 4:56 pm to DR. SAM JACOUBOWICZ, who verbally acknowledged these results. Electronically Signed   By: Kathreen Devoid   On: 09/30/2015 17:00    EKG: Independently reviewed. Sinus rhythm   Assessment/Plan Active Problems:   PE (pulmonary embolism) Right ower extremity DVT  Will start the patient on Lovenox perform his consultation. Discussed in detail with patient regarding long-term anticoagulation with NOAC  versus Coumadin, and at this time patient wants to go with Coumadin. We'll start Coumadin per pharmacy consultation. If patient is stable can be discharged a.m. on Lovenox and Coumadin.  Code status: Full code  Family discussion: Admission, patients condition and plan of care including tests being ordered have been discussed with the patient and his wife at bedside* who indicate understanding and agree with the plan and Code Status.   Time Spent on Admission: 60 min  [facility] Pager: [phone] 09/30/2015, 6:21 PM  If 7PM-7AM, please contact night-coverage  [email or web address]  Password [email or web address]

## 2015-09-30 NOTE — Progress Notes (Signed)
VASCULAR LAB PRELIMINARY  PRELIMINARY  PRELIMINARY  PRELIMINARY  Right lower extremity venous duplex completed.    Preliminary report:  Right - Positive for deep vein thrombosis. Chronic mid femoral to popliteal. Chronic and ACUTE in the popliteal vein. Also noted is what appears to be acute DVT in the gastrocnemius vein. There was no mention of a thrombus in previous study of 2014 at another facility. There is age nondeterminate DVT of the posterior tibial and peroneal veins. There is no evidence of superficial thrombosis or Baker's cyst.  Robey Massmann, RVS 09/30/2015, 1:21 PM

## 2015-09-30 NOTE — ED Notes (Signed)
Patient transported to Ultrasound 

## 2015-09-30 NOTE — ED Provider Notes (Signed)
Patient's care assumed from [facility], PA-C, please see his note for full details of H&P. In brief patient is a 64 year old male with past medical history significant for provoked DVT. Patient drives long distances for work. Was on Xarelto, discontinued after 6 month course. Patient presents with lower leg pain and swelling. Ultrasound of the lower extremity showed a DVT. Patient also with intermittent sharp chest pain. Obtaining a CTA PE protocol to evaluate for pulmonary embolism. Plan is to restart Xarelto if no findings of PE and discharge home.  Patient found to have a pulmonary embolism. Started on Lovenox. Patient will be admitted to family medicine. Stable for the floor. Transferred to the floor in stable condition.  Nathaniel Man, MD 10/01/15 [id number]  Orlie Dakin, MD 10/05/15 [id number]

## 2015-09-30 NOTE — ED Notes (Signed)
Pt C/o intermittent right calf pain and swelling onset x 1 month. Pt reports history of blood in clot in right leg and was taking xerelto for 6 months.

## 2015-09-30 NOTE — Progress Notes (Addendum)
ANTICOAGULATION CONSULT NOTE - Initial Consult  Pharmacy Consult for Lovenox + Coumadin Indication: Acute DVT   No Known Allergies  Patient Measurements: Height: 5' 10.5" (179.1 cm) Weight: 235 lb (106.595 kg) IBW/kg (Calculated) : 74.15  Vital Signs: Temp: 98.2 F (36.8 C) (12/02 1353) Temp Source: Oral (12/02 1353) BP: 123/77 mmHg (12/02 1700) Pulse Rate: 74 (12/02 1700)  Labs:  Recent Labs  09/30/15 1443 09/30/15 1728  HGB 15.0 14.0  HCT 44.0 42.1  PLT  --  159  APTT  --  34  LABPROT  --  14.7  INR  --  1.13  CREATININE 1.10  --     Estimated Creatinine Clearance: 85.9 mL/min (by C-G formula based on Cr of 1.1).   Medical History: Past Medical History  Diagnosis Date  . Kidney stones   . Pulmonary embolism ([place]) 05/26/2013  . Right leg DVT ([place]) 05/26/2013  . Shortness of breath     "@ anytime over the last 5 weeks; probably from the blood clots" (05/26/2013)    Medications:   (Not in a hospital admission)  Assessment: 62 YOM who presented with lower extremity swelling. He has PMH significant for DVT and PE in April 2014. Patient was on anticoagulation until February 2015. Pharmacy consulted to start Lovenox. Currently only on aspirin at home. CrCl ~ 85 mL/min   Goal of Therapy:  Anti-Xa level 0.6-1 units/ml 4hrs after LMWH dose given Monitor platelets by anticoagulation protocol: Yes   Plan:  -Start Lovenox 105 mg SubQ Q 12 hours  -Monitor CBC Q 72 hours, renal fx, and s/s of bleeding -F/u long term anticoagulation plans    Albertina Parr, PharmD., BCPS Clinical Pharmacist Pager [phone]  Addendum: Patient to begin coumadin. Will need at least a 5 day overlap with therapeutic lovenox. Baseline INR 1.13. Coumadin score = 7.  Plan: -Coumadin 7.5mg  x 1 -Daily INR -Coumadin education  Nena Jordan, PharmD, BCPS 09/30/2015, 8:03 PM

## 2015-09-30 NOTE — Progress Notes (Signed)
Attempted to call ED twice, but did not receive answer.  Will attempt again shortly.

## 2015-09-30 NOTE — ED Provider Notes (Signed)
Complains of right-sided chest pain with mild dyspnea and right leg swelling onset last night. Patient has been noncompliant with his aspirin therapy. Has history of PE and DVT. Formerly on [employer], none since 2014. On exam patient is alert no distress speaks in paragraphs. Lungs clear auscultation heart regular rate and rhythm right lower extremity mildly swollen calf. No tenderness, neurovascularly intact. Left lower extremity without edema. Neurovascular intact. Spoke with Dr. Darrick Meigs who comes to evaluate patient  Juan Dakin, MD 10/01/15 [phone]

## 2015-09-30 NOTE — ED Provider Notes (Signed)
CSN: [id number]     Arrival date & time 09/30/15  1028 History   First MD Initiated Contact with Patient 09/30/15 1040     Chief Complaint  Patient presents with  . Leg Pain  . Leg Swelling   HPI   64 year old male presents today with right lower extremity swelling. 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 Patient received several to for 6 months until February 2015. Patient had a negative thrombophilia panel, with likely clots due to immobilization. Patient was started on aspirin therapy and continues to take aspirin daily. Patient reports approximately 2 weeks ago he had swelling to his right knee which he had drained by orthopedics, likely due to physical activity as he went to a festival the weekend prior. Patient notes that over the last week he's had right lower extremity swelling diffusely, no swelling to the left lower extremity. He reports pain to the calf worse with ambulation. Patient also notes that he has intermittent lower chest upper abdomen pain that appears to resolves in different locations. He denies any shortness of breath, persistent chest pain, diaphoresis, cough, fever, nausea, vomiting, changes in bowel or bladder characteristics or frequency. Patient denies any recent prolonged immobilization, active malignancy, smoking, or any other risk factors for pulmonary embolus or DVT.  Past Medical History  Diagnosis Date  . Kidney stones   . Pulmonary embolism ([place]) 05/26/2013  . Right leg DVT ([place]) 05/26/2013  . Shortness of breath     "@ anytime over the last 5 weeks; probably from the blood clots" (05/26/2013)   Past Surgical History  Procedure Laterality Date  . Lithotripsy  1990's  . Knee arthroscopy w/ acl reconstruction Right 1980's   No family history on file. Social History  Substance Use Topics  . Smoking status: Never Smoker   . Smokeless tobacco: Never Used  . Alcohol Use: Yes   Comment: 05/26/2013 "once/month, maybe a beer"    Review of Systems  All other systems reviewed and are negative.   Allergies  Review of patient's allergies indicates no known allergies.  Home Medications   Prior to Admission medications   Medication Sig Start Date End Date Taking? Authorizing Provider  hydrocortisone cream 1 % Apply 1-3 application topically daily as needed (Skin irritation). Apply to bridge of nose and/or chest    Historical Provider, MD  ibuprofen (ADVIL,MOTRIN) 200 MG tablet Take 400 mg by mouth every 6 (six) hours as needed for pain.    Historical Provider, MD   BP 123/79 mmHg  Pulse 86  Temp(Src) 98.6 F (37 C) (Oral)  Resp 18  Ht 5' 10.5" (1.791 m)  Wt 106.595 kg  BMI 33.23 kg/m2  SpO2 97%   Physical Exam  Constitutional: He is oriented to person, place, and time. He appears well-developed and well-nourished.  HENT:  Head: Normocephalic and atraumatic.  Eyes: Conjunctivae are normal. Pupils are equal, round, and reactive to light. Right eye exhibits no discharge. Left eye exhibits no discharge. No scleral icterus.  Neck: Normal range of motion. No JVD present. No tracheal deviation present.  Cardiovascular: Normal rate, regular rhythm, normal heart sounds and intact distal pulses.  Exam reveals no gallop and no friction rub.   No murmur heard. Pulmonary/Chest: Effort normal and breath sounds normal. No stridor. No respiratory distress. He has no wheezes. He has no rales. He exhibits no tenderness.  Abdominal: Soft. He exhibits no distension.  Musculoskeletal: Normal  range of motion. He exhibits edema. He exhibits no tenderness.  Right lower extremity edema. Nontender to palpation no signs of trauma, no redness, warmth to touch  Neurological: He is alert and oriented to person, place, and time. Coordination normal.  Skin: Skin is warm and dry. No erythema. No pallor.  Psychiatric: He has a normal mood and affect. His behavior is normal. Judgment and  thought content normal.  Nursing note and vitals reviewed.    ED Course  Procedures (including critical care time) Labs Review Labs Reviewed - No data to display  Imaging Review No results found. I have personally reviewed and evaluated these images and lab results as part of my medical decision-making.   EKG Interpretation None      MDM   Final diagnoses:  DVT (deep venous thrombosis) (HCC)  Chest pain    Labs: I-STAT Chem-8  Imaging: Lower extremity venous ultrasound, CT PE study- positive for DVT  Consults:  Therapeutics:  Discharge Meds:   Assessment/Plan: Patient presents with unilateral leg swelling, history of DVT PE. 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 Patient also has intermittent chest pain, with significant history of lower extremity DVTs patient will receive a CT scan here in the ED for further evaluation. Patient previously had taken Xeralto for DVTs, no complications with this therapy.  Pt care signed out to Nathaniel Man MD at shift change pending PE study.         Okey Regal, PA-C 09/30/15 1623  Leo Grosser, MD 10/02/15 [phone]

## 2015-10-01 DIAGNOSIS — I824Z1 Acute embolism and thrombosis of unspecified deep veins of right distal lower extremity: Secondary | ICD-10-CM

## 2015-10-01 DIAGNOSIS — I2699 Other pulmonary embolism without acute cor pulmonale: Secondary | ICD-10-CM

## 2015-10-01 LAB — CBC
HEMATOCRIT: 41.9 % (ref 39.0–52.0)
HEMOGLOBIN: 14 g/dL (ref 13.0–17.0)
MCH: 29.9 pg (ref 26.0–34.0)
MCHC: 33.4 g/dL (ref 30.0–36.0)
MCV: 89.3 fL (ref 78.0–100.0)
PLATELETS: 157 10*3/uL (ref 150–400)
RBC: 4.69 MIL/uL (ref 4.22–5.81)
RDW: 13.3 % (ref 11.5–15.5)
WBC: 8.9 10*3/uL (ref 4.0–10.5)

## 2015-10-01 LAB — COMPREHENSIVE METABOLIC PANEL
ALBUMIN: 3.1 g/dL — AB (ref 3.5–5.0)
ALT: 16 U/L — ABNORMAL LOW (ref 17–63)
ANION GAP: 9 (ref 5–15)
AST: 24 U/L (ref 15–41)
Alkaline Phosphatase: 92 U/L (ref 38–126)
BILIRUBIN TOTAL: 1 mg/dL (ref 0.3–1.2)
BUN: 13 mg/dL (ref 6–20)
CHLORIDE: 106 mmol/L (ref 101–111)
CO2: 24 mmol/L (ref 22–32)
Calcium: 8.8 mg/dL — ABNORMAL LOW (ref 8.9–10.3)
Creatinine, Ser: 1.22 mg/dL (ref 0.61–1.24)
GFR calc Af Amer: >60 mL/min (ref >60)
Glucose, Bld: 97 mg/dL (ref 65–99)
POTASSIUM: 4.2 mmol/L (ref 3.5–5.1)
Sodium: 139 mmol/L (ref 135–145)
TOTAL PROTEIN: 6.4 g/dL — AB (ref 6.5–8.1)

## 2015-10-01 LAB — PROTIME-INR
INR: 1.2 (ref 0.00–1.49)
PROTHROMBIN TIME: 15.4 s — AB (ref 11.6–15.2)

## 2015-10-01 MED ORDER — ENOXAPARIN SODIUM 80 MG/0.8ML ~~LOC~~ SOLN: 1.5000 mg/kg | SUBCUTANEOUS | Status: DC

## 2015-10-01 MED ORDER — ENOXAPARIN (LOVENOX) PATIENT EDUCATION KIT
PACK | Freq: Once | Status: AC
  Administered 2015-10-01: 10:00:00
  Filled 2015-10-01: qty 1

## 2015-10-01 MED ORDER — WARFARIN SODIUM 5 MG PO TABS: 5.0000 mg | ORAL_TABLET | Freq: Every day | ORAL | Status: AC

## 2015-10-01 MED ORDER — ENOXAPARIN SODIUM 150 MG/ML ~~LOC~~ SOLN
1.5000 mg/kg | SUBCUTANEOUS | Status: DC
  Administered 2015-10-01: 160 mg via SUBCUTANEOUS
  Filled 2015-10-01: qty 1.07

## 2015-10-01 NOTE — Discharge Instructions (Signed)
Follow with MYERS,BRITTANY, PA-C in 3 days  Please have your INR (Coumadin Levels) checked on Tuesday 12/6  Please get a complete blood count and chemistry panel checked by your Primary MD at your next visit, and again as instructed by your Primary MD. Please get your medications reviewed and adjusted by your Primary MD.  Please request your Primary MD to go over all Hospital Tests and Procedure/Radiological results at the follow up, please get all Hospital records sent to your Prim MD by signing hospital release before you go home.  If you had Pneumonia of Lung problems at the Hospital: Please get a 2 view Chest X ray done in 6-8 weeks after hospital discharge or sooner if instructed by your Primary MD.  If you have Congestive Heart Failure: Please call your Cardiologist or Primary MD anytime you have any of the following symptoms:  1) 3 pound weight gain in 24 hours or 5 pounds in 1 week  2) shortness of breath, with or without a dry hacking cough  3) swelling in the hands, feet or stomach  4) if you have to sleep on extra pillows at night in order to breathe  Follow cardiac low salt diet and 1.5 lit/day fluid restriction.  If you have diabetes Accuchecks 4 times/day, Once in AM empty stomach and then before each meal. Log in all results and show them to your primary doctor at your next visit. If any glucose reading is under 80 or above 300 call your primary MD immediately.  If you have Seizure/Convulsions/Epilepsy: Please do not drive, operate heavy machinery, participate in activities at heights or participate in high speed sports until you have seen by Primary MD or a Neurologist and advised to do so again.  If you had Gastrointestinal Bleeding: Please ask your Primary MD to check a complete blood count within one week of discharge or at your next visit. Your endoscopic/colonoscopic biopsies that are pending at the time of discharge, will also need to followed by your Primary  MD.  Get Medicines reviewed and adjusted. Please take all your medications with you for your next visit with your Primary MD  Please request your Primary MD to go over all hospital tests and procedure/radiological results at the follow up, please ask your Primary MD to get all Hospital records sent to his/her office.  If you experience worsening of your admission symptoms, develop shortness of breath, life threatening emergency, suicidal or homicidal thoughts you must seek medical attention immediately by calling 911 or calling your MD immediately  if symptoms less severe.  You must read complete instructions/literature along with all the possible adverse reactions/side effects for all the Medicines you take and that have been prescribed to you. Take any new Medicines after you have completely understood and accpet all the possible adverse reactions/side effects.   Do not drive or operate heavy machinery when taking Pain medications.   Do not take more than prescribed Pain, Sleep and Anxiety Medications  Special Instructions: If you have smoked or chewed Tobacco  in the last 2 yrs please stop smoking, stop any regular Alcohol  and or any Recreational drug use.  Wear Seat belts while driving.  Please note You were cared for by a hospitalist during your hospital stay. If you have any questions about your discharge medications or the care you received while you were in the hospital after you are discharged, you can call the unit and asked to speak with the hospitalist on call if the  hospitalist that took care of you is not available. Once you are discharged, your primary care physician will handle any further medical issues. Please note that NO REFILLS for any discharge medications will be authorized once you are discharged, as it is imperative that you return to your primary care physician (or establish a relationship with a primary care physician if you do not have one) for your aftercare needs so  that they can reassess your need for medications and monitor your lab values.  You can reach the hospitalist office at phone [phone] or fax [phone]   If you do not have a primary care physician, you can call [phone] for a physician referral.  Activity: As tolerated with Full fall precautions use walker/cane & assistance as needed  Diet:regular  Disposition Home

## 2015-10-01 NOTE — Progress Notes (Signed)
Discharge instructions and medications discussed with patient.  Prescriptions given to patient.  All questions answered.  

## 2015-10-01 NOTE — Discharge Summary (Signed)
Physician Discharge Summary  Juan [name] [id number] DOB: 12-18-1950 DOA: 09/30/2015  PCP: Juan [name]  Admit date: 09/30/2015 Discharge date: 10/01/2015  Time spent: > 30 minutes  Recommendations for Outpatient Follow-up:  1. Follow up with Juan [name] in 3 days for INR check 2. Follow up with Dr. Alen [name] in 1 week if patient prefers   Discharge Diagnoses:  Active Problems:   PE (pulmonary embolism)   Right leg DVT [facility])  Discharge Condition: stable  Diet recommendation: regular  Filed Weights   09/30/15 1038 09/30/15 1955  Weight: 106.595 kg (235 lb) 104.327 kg (230 lb)    History of present illness:  See H&P, Labs, Consult and Test reports for all details in brief, patient is a 64 year old male with medical history of Kidney stones; Pulmonary embolism ([place]) (05/26/2013); Right leg DVT ([place]) (05/26/2013). Today patient came to the hospital with ongoing leg pain for 3 weeks and development of shortness of breath of one day duration, found to have PE and DVT.   Hospital Course:  Patient was admitted to the hospital with a new diagnosis of PE as well as DVT. His pulmonary emboli there were limited to his right lower lobe without evidence of right heart strain. Patient was hemodynamically stable, not tachycardic, and without hypoxia. Admission M.D. as well as myself discussed with the patient about anticoagulation options, risks versus benefits, patient was on Xarelto during the past for PE which he discontinued last year. He did not wish to go back on Xarelto due to cost, and chooses to be on Coumadin. He was started on Lovenox injections to bridge with Coumadin. He received education from the nursing staff regarding self injections, patient was discharged home in stable condition, on Lovenox and Coumadin. He is not sure whether he wants his primary doctor or Dr. Alen [name] from oncology to manage his anticoagulation, and will call his PCP on Monday morning. He will need INR  to be obtained likely Tuesday the latest, this was discussed extensively with the patient, he expressed understanding, and appears to have good insight into his medical condition. For his pulmonary nodules likely need follow-up in 6-12 months. He will likely need lifelong anticoagulation since this is his second episode of DVT/PE.  Procedures:  None    Consultations:  None   Discharge Exam: Filed Vitals:   09/30/15 1914 09/30/15 1930 09/30/15 1955 10/01/15 0606  BP:  112/64 125/72 97/64  Pulse:  67 69 67  Temp:   98.6 F (37 C) 98.6 F (37 C)  TempSrc:      Resp:  18 20 18   Height:      Weight:   104.327 kg (230 lb)   SpO2: 95% 94% 98% 96%   General: NAD Cardiovascular: RRR Respiratory: CTA biL  Discharge Instructions Activity:  As tolerated   Get Medicines reviewed and adjusted: Please take all your medications with you for your next visit with your Primary [name]  Please request your Primary [name] to go over all hospital tests and procedure/radiological results at the follow up, please ask your Primary [name] to get all Hospital records sent to his/her office.  If you experience worsening of your admission symptoms, develop shortness of breath, life threatening emergency, suicidal or homicidal thoughts you must seek medical attention immediately by calling 911 or calling your [name] immediately if symptoms less severe.  You must read complete instructions/literature along with all the possible adverse reactions/side effects for all the Medicines you take and that have been prescribed  to you. Take any new Medicines after you have completely understood and accpet all the possible adverse reactions/side effects.   Do not drive when taking Pain medications.   Do not take more than prescribed Pain, Sleep and Anxiety Medications  Special Instructions: If you have smoked or chewed Tobacco in the last 2 yrs please stop smoking, stop any regular Alcohol and or any Recreational drug  use.  Wear Seat belts while driving.  Please note  You were cared for by a hospitalist during your hospital stay. Once you are discharged, your primary care physician will handle any further medical issues. Please note that NO REFILLS for any discharge medications will be authorized once you are discharged, as it is imperative that you return to your primary care physician (or establish a relationship with a primary care physician if you do not have one) for your aftercare needs so that they can reassess your need for medications and monitor your lab values.    Medication List    STOP taking these medications        aspirin 81 MG tablet      TAKE these medications        enoxaparin 80 MG/0.8ML injection  Commonly known as:  LOVENOX  Inject 1.6 mLs (160 mg total) into the skin daily.     warfarin 5 MG tablet  Commonly known as:  COUMADIN  Take 1 tablet (5 mg total) by mouth daily.           Follow-up Information    Follow up with MYERS,BRITTANY, PA-C. Schedule an appointment as soon as possible for a visit in 3 days.   Specialty:  Physician Assistant   Contact information:   [address] [place] [address] [phone]       Schedule an appointment as soon as possible for a visit with Juan [name].   Specialty:  Oncology   Contact information:   [name] [place] [address] [phone]       The results of significant diagnostics from this hospitalization (including imaging, microbiology, ancillary and laboratory) are listed below for reference.    Significant Diagnostic Studies: Ct Angio Chest Pe W/cm &/or Wo Cm  09/30/2015  CLINICAL DATA:  Recent diagnosis of deep venous thrombosis. Chest pain. EXAM: CT ANGIOGRAPHY CHEST WITH CONTRAST TECHNIQUE: Multidetector CT imaging of the chest was performed using the standard protocol during bolus administration of intravenous contrast. Multiplanar CT image reconstructions and MIPs  were obtained to evaluate the vascular anatomy. CONTRAST:  150mL OMNIPAQUE IOHEXOL 350 MG/ML SOLN COMPARISON:  None. FINDINGS: There is adequate opacification of the pulmonary arteries. There are pulmonary emboli in the right lower lobe segmental pulmonary artery branches. The main pulmonary artery, right main pulmonary artery and left main pulmonary arteries are normal in size. The heart size is normal. There is no pericardial effusion. There is right basilar and right middle lobe atelectasis. There is mild lingular atelectasis. There is a small right pleural effusion. There is no pneumothorax. There are 2 right upper lobe pulmonary nodules measuring 6 mm and 5 mm respectively abutting each other similar in appearance to the prior exam of 12/01/2013. There is no axillary, hilar, or mediastinal adenopathy. There is no lytic or blastic osseous lesion. The visualized portions of the upper abdomen are unremarkable. Review of the MIP images confirms the above findings. IMPRESSION: 1. Pulmonary emboli involving the right lower lobe. No evidence of heart strain. 2. There are 2 right  upper lobe pulmonary nodules measuring 6 mm and 5 mm respectively abutting each other similar in appearance to the prior exam of 12/01/2013. If the patient is at high risk for bronchogenic carcinoma, follow-up chest CT at 6-12 months is recommended. If the patient is at low risk for bronchogenic carcinoma, follow-up chest CT at 12 months is recommended. This recommendation follows the consensus statement: Guidelines for Management of Small Pulmonary Nodules Detected on CT Scans: A Statement from the [place] as published in Radiology 2005;237:395-400. Critical Value/emergent results were called by telephone at the time of interpretation on 09/30/2015 at 4:56 pm to DR. SAM JACOUBOWICZ, who verbally acknowledged these results. Electronically Signed   By: Kathreen Devoid   On: 09/30/2015 17:00    Labs: Basic Metabolic Panel:  Recent  Labs Lab 09/30/15 1443 10/01/15 0408  NA 140 139  K 4.2 4.2  CL 103 106  CO2  --  24  GLUCOSE 83 97  BUN 14 13  CREATININE 1.10 1.22  CALCIUM  --  8.8*   Liver Function Tests:  Recent Labs Lab 10/01/15 0408  AST 24  ALT 16*  ALKPHOS 92  BILITOT 1.0  PROT 6.4*  ALBUMIN 3.1*   CBC:  Recent Labs Lab 09/30/15 1443 09/30/15 1728 10/01/15 0408  WBC  --  9.4 8.9  NEUTROABS  --  5.6  --   HGB 15.0 14.0 14.0  HCT 44.0 42.1 41.9  MCV  --  89.0 89.3  PLT  --  159 157     Signed:  Shyler Holzman  [facility] 10/01/2015, 3:17 PM

## 2015-10-01 NOTE — Progress Notes (Signed)
Patient correctly self administered Lovenox injection while RN observed.  Patient stated he feels comfortable self administering medication via injection.

## 2015-10-01 NOTE — Care Management (Signed)
CM contacted patient regarding discharge needs, patient reports self pay for prescriptions. Patient was discharged home on lovenox coumadin bridge. Patient made aware of the cost of Lovenox, voiced unable to afford the cost. Discussed MATCH Program medication assistance and the guidelines. Patient is agreeable to terms, including the $3 co-pay per prescription. Patient requesting that [facility] letter be faxed to [facility] on [address] in [place] [phone] fax confirmation received. No further CM needs identified

## 2017-10-29 DIAGNOSIS — C801 Malignant (primary) neoplasm, unspecified: Secondary | ICD-10-CM

## 2017-10-29 HISTORY — DX: Malignant (primary) neoplasm, unspecified: C80.1

## 2017-10-29 HISTORY — PX: MELANOMA EXCISION: SHX5266

## 2018-02-03 DIAGNOSIS — I2699 Other pulmonary embolism without acute cor pulmonale: Secondary | ICD-10-CM | POA: Diagnosis not present

## 2018-02-03 DIAGNOSIS — Z7901 Long term (current) use of anticoagulants: Secondary | ICD-10-CM | POA: Diagnosis not present

## 2018-03-03 DIAGNOSIS — I2699 Other pulmonary embolism without acute cor pulmonale: Secondary | ICD-10-CM | POA: Diagnosis not present

## 2018-03-03 DIAGNOSIS — Z7901 Long term (current) use of anticoagulants: Secondary | ICD-10-CM | POA: Diagnosis not present

## 2018-03-31 DIAGNOSIS — I82431 Acute embolism and thrombosis of right popliteal vein: Secondary | ICD-10-CM | POA: Diagnosis not present

## 2018-03-31 DIAGNOSIS — Z7901 Long term (current) use of anticoagulants: Secondary | ICD-10-CM | POA: Diagnosis not present

## 2018-04-30 DIAGNOSIS — Z7901 Long term (current) use of anticoagulants: Secondary | ICD-10-CM | POA: Diagnosis not present

## 2018-04-30 DIAGNOSIS — Z1389 Encounter for screening for other disorder: Secondary | ICD-10-CM | POA: Diagnosis not present

## 2018-04-30 DIAGNOSIS — Z86711 Personal history of pulmonary embolism: Secondary | ICD-10-CM | POA: Diagnosis not present

## 2018-05-30 DIAGNOSIS — I2699 Other pulmonary embolism without acute cor pulmonale: Secondary | ICD-10-CM | POA: Diagnosis not present

## 2018-05-30 DIAGNOSIS — Z7901 Long term (current) use of anticoagulants: Secondary | ICD-10-CM | POA: Diagnosis not present

## 2018-06-13 DIAGNOSIS — I82431 Acute embolism and thrombosis of right popliteal vein: Secondary | ICD-10-CM | POA: Diagnosis not present

## 2018-06-13 DIAGNOSIS — Z7901 Long term (current) use of anticoagulants: Secondary | ICD-10-CM | POA: Diagnosis not present

## 2018-06-27 DIAGNOSIS — Z7901 Long term (current) use of anticoagulants: Secondary | ICD-10-CM | POA: Diagnosis not present

## 2018-06-27 DIAGNOSIS — I2699 Other pulmonary embolism without acute cor pulmonale: Secondary | ICD-10-CM | POA: Diagnosis not present

## 2018-07-14 DIAGNOSIS — I2699 Other pulmonary embolism without acute cor pulmonale: Secondary | ICD-10-CM | POA: Diagnosis not present

## 2018-07-14 DIAGNOSIS — Z7901 Long term (current) use of anticoagulants: Secondary | ICD-10-CM | POA: Diagnosis not present

## 2018-08-11 DIAGNOSIS — I2699 Other pulmonary embolism without acute cor pulmonale: Secondary | ICD-10-CM | POA: Diagnosis not present

## 2018-08-11 DIAGNOSIS — Z7901 Long term (current) use of anticoagulants: Secondary | ICD-10-CM | POA: Diagnosis not present

## 2018-08-19 DIAGNOSIS — Z7901 Long term (current) use of anticoagulants: Secondary | ICD-10-CM | POA: Diagnosis not present

## 2018-08-19 DIAGNOSIS — I2699 Other pulmonary embolism without acute cor pulmonale: Secondary | ICD-10-CM | POA: Diagnosis not present

## 2018-09-02 DIAGNOSIS — Z7901 Long term (current) use of anticoagulants: Secondary | ICD-10-CM | POA: Diagnosis not present

## 2018-09-02 DIAGNOSIS — I2699 Other pulmonary embolism without acute cor pulmonale: Secondary | ICD-10-CM | POA: Diagnosis not present

## 2018-09-30 DIAGNOSIS — I2699 Other pulmonary embolism without acute cor pulmonale: Secondary | ICD-10-CM | POA: Diagnosis not present

## 2018-09-30 DIAGNOSIS — Z7901 Long term (current) use of anticoagulants: Secondary | ICD-10-CM | POA: Diagnosis not present

## 2018-10-15 DIAGNOSIS — Z136 Encounter for screening for cardiovascular disorders: Secondary | ICD-10-CM | POA: Diagnosis not present

## 2018-10-15 DIAGNOSIS — Z131 Encounter for screening for diabetes mellitus: Secondary | ICD-10-CM | POA: Diagnosis not present

## 2018-10-15 DIAGNOSIS — Z0000 Encounter for general adult medical examination without abnormal findings: Secondary | ICD-10-CM | POA: Diagnosis not present

## 2018-10-15 DIAGNOSIS — Z1389 Encounter for screening for other disorder: Secondary | ICD-10-CM | POA: Diagnosis not present

## 2018-10-15 DIAGNOSIS — I2699 Other pulmonary embolism without acute cor pulmonale: Secondary | ICD-10-CM | POA: Diagnosis not present

## 2018-10-15 DIAGNOSIS — Z1211 Encounter for screening for malignant neoplasm of colon: Secondary | ICD-10-CM | POA: Diagnosis not present

## 2018-10-15 DIAGNOSIS — Z125 Encounter for screening for malignant neoplasm of prostate: Secondary | ICD-10-CM | POA: Diagnosis not present

## 2018-10-15 DIAGNOSIS — Z7901 Long term (current) use of anticoagulants: Secondary | ICD-10-CM | POA: Diagnosis not present

## 2018-10-28 DIAGNOSIS — R3915 Urgency of urination: Secondary | ICD-10-CM | POA: Diagnosis not present

## 2018-10-28 DIAGNOSIS — R972 Elevated prostate specific antigen [PSA]: Secondary | ICD-10-CM | POA: Diagnosis not present

## 2018-10-28 DIAGNOSIS — N401 Enlarged prostate with lower urinary tract symptoms: Secondary | ICD-10-CM | POA: Diagnosis not present

## 2018-10-31 DIAGNOSIS — I2699 Other pulmonary embolism without acute cor pulmonale: Secondary | ICD-10-CM | POA: Diagnosis not present

## 2018-10-31 DIAGNOSIS — Z7901 Long term (current) use of anticoagulants: Secondary | ICD-10-CM | POA: Diagnosis not present

## 2018-11-21 DIAGNOSIS — Z7901 Long term (current) use of anticoagulants: Secondary | ICD-10-CM | POA: Diagnosis not present

## 2018-11-21 DIAGNOSIS — Z86711 Personal history of pulmonary embolism: Secondary | ICD-10-CM | POA: Diagnosis not present

## 2018-12-01 DIAGNOSIS — I2699 Other pulmonary embolism without acute cor pulmonale: Secondary | ICD-10-CM | POA: Diagnosis not present

## 2018-12-01 DIAGNOSIS — Z7901 Long term (current) use of anticoagulants: Secondary | ICD-10-CM | POA: Diagnosis not present

## 2018-12-08 DIAGNOSIS — C61 Malignant neoplasm of prostate: Secondary | ICD-10-CM | POA: Diagnosis not present

## 2018-12-08 DIAGNOSIS — R972 Elevated prostate specific antigen [PSA]: Secondary | ICD-10-CM | POA: Diagnosis not present

## 2019-01-07 DIAGNOSIS — N401 Enlarged prostate with lower urinary tract symptoms: Secondary | ICD-10-CM | POA: Diagnosis not present

## 2019-01-07 DIAGNOSIS — C61 Malignant neoplasm of prostate: Secondary | ICD-10-CM | POA: Diagnosis not present

## 2019-01-07 DIAGNOSIS — R3915 Urgency of urination: Secondary | ICD-10-CM | POA: Diagnosis not present

## 2019-01-09 ENCOUNTER — Encounter: Payer: Self-pay | Admitting: *Deleted

## 2019-01-12 ENCOUNTER — Telehealth: Payer: Self-pay | Admitting: Medical Oncology

## 2019-01-12 NOTE — Telephone Encounter (Signed)
Left message requesting a return call to discuss referral to the [facility].

## 2019-01-13 ENCOUNTER — Encounter: Payer: Self-pay | Admitting: Medical Oncology

## 2019-01-14 DIAGNOSIS — D0361 Melanoma in situ of right upper limb, including shoulder: Secondary | ICD-10-CM | POA: Diagnosis not present

## 2019-01-14 DIAGNOSIS — L400 Psoriasis vulgaris: Secondary | ICD-10-CM | POA: Diagnosis not present

## 2019-01-14 DIAGNOSIS — D225 Melanocytic nevi of trunk: Secondary | ICD-10-CM | POA: Diagnosis not present

## 2019-01-14 DIAGNOSIS — L570 Actinic keratosis: Secondary | ICD-10-CM | POA: Diagnosis not present

## 2019-01-14 DIAGNOSIS — D485 Neoplasm of uncertain behavior of skin: Secondary | ICD-10-CM | POA: Diagnosis not present

## 2019-01-14 DIAGNOSIS — D2361 Other benign neoplasm of skin of right upper limb, including shoulder: Secondary | ICD-10-CM | POA: Diagnosis not present

## 2019-01-15 ENCOUNTER — Encounter: Payer: Self-pay | Admitting: Medical Oncology

## 2019-01-16 NOTE — Progress Notes (Signed)
GU Location of Tumor / Histology: prostatic adenocarcinoma  If Prostate Cancer, Gleason Score is (4 + 3) and PSA is (12). Prostate volume: 48 ml.   Juan [name] was referred by Dr Nancy Fetter to Dr. Jeffie Pollock for further evaluation of an elevated PSA of 13.36. Patient's PSA prior to this was 2.14 in 2003 and 2.52 in 2002.  Biopsies of prostate (if applicable) revealed:    Past/Anticipated interventions by urology, if any: prostate biopsy, referral to [facility]  Past/Anticipated interventions by medical oncology, if any: no  Weight changes, if any: no  Bowel/Bladder complaints, if any: Reports urinary urgency. Reports nocturia x 0-1.    Nausea/Vomiting, if any: no  Pain issues, if any:    SAFETY ISSUES:  Prior radiation?   Pacemaker/ICD?   Possible current pregnancy? no, male patient  Is the patient on methotrexate?   Current Complaints / other details:  68 year old male. No family hx of prostate ca. Married. NKDA.  Due to Covid 19 patient had a virtual visit with the MD thus an assessment was not done by this RN.

## 2019-01-19 ENCOUNTER — Telehealth: Payer: Self-pay | Admitting: Medical Oncology

## 2019-01-19 NOTE — Progress Notes (Signed)
I called pt to introduce myself as the Prostate Nurse Navigator and the Coordinator of the Prostate [place].  1. I confirmed with the patient he is aware of his referral to the clinic 01/20/19 arriving at 12:30 pm.  2. I discussed the format of the clinic and the physicians he will be seeing that day.  3. I discussed where the clinic is located and how to contact me.  4. I confirmed his address and informed him I would be mailing a packet of information and forms to be completed. I asked him to bring them with him the day of his appointment.   He voiced understanding of the above. I asked him to call me if he has any questions or concerns regarding his appointments or the forms he needs to complete.

## 2019-01-19 NOTE — Telephone Encounter (Signed)
Spoke with Juan [name] to notify him that the [facility] will be held by teleconference. I explained the physicians will meet by Web-Ex and then each physician will contact him. He is in agreement and  I asked him to call me if I can assist in any way. He voiced understanding.

## 2019-01-19 NOTE — Telephone Encounter (Signed)
Left message requesting a return call to discuss PMDC. I informed patient that we will do consults by phone due COVID-19 virus.

## 2019-01-20 ENCOUNTER — Inpatient Hospital Stay: Payer: MEDICAID | Attending: Oncology | Admitting: Oncology

## 2019-01-20 ENCOUNTER — Encounter: Payer: Self-pay | Admitting: Medical Oncology

## 2019-01-20 ENCOUNTER — Encounter: Payer: Self-pay | Admitting: Radiation Oncology

## 2019-01-20 ENCOUNTER — Other Ambulatory Visit: Payer: Self-pay

## 2019-01-20 ENCOUNTER — Ambulatory Visit
Admission: RE | Admit: 2019-01-20 | Discharge: 2019-01-20 | Disposition: A | Discharge status: Home / Self Care | Payer: MEDICAID | Source: Ambulatory Visit | Attending: Radiation Oncology | Admitting: Radiation Oncology

## 2019-01-20 DIAGNOSIS — R972 Elevated prostate specific antigen [PSA]: Secondary | ICD-10-CM | POA: Diagnosis not present

## 2019-01-20 DIAGNOSIS — C61 Malignant neoplasm of prostate: Secondary | ICD-10-CM

## 2019-01-20 NOTE — Progress Notes (Signed)
Virtual Visit via Telephone Note  I connected with Juan [name] on 01/20/19 at  1:15 PM EDT by telephone and verified that I am speaking with the correct person using two identifiers.   I discussed the limitations, risks, security and privacy concerns of performing an evaluation and management service by telephone and the availability of in person appointments. I also discussed with the patient that there may be a patient responsible charge related to this service. The patient expressed understanding and agreed to proceed.   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  He has been on lifetime warfarin since that time.  He presented with an elevated PSA of 13.36 and subsequently had a prostate biopsy completed on February 10 of 2020.  He had a Gleason score 3+3 equal 6 and 10% of a core and a 4+3 = 7 in 80% of the core.    Observations/Objective:  He reports his urinary tract symptoms has actually been slightly exaggerated in his recent form that he filled with Dr. Jeffie Pollock.  He reports he has no major nocturia or dysuria.  He does have very little frequency.   Assessment and Plan:  His case was discussed today in the prostate cancer multidisciplinary clinic.  His pathology was also reviewed with the reviewing pathologist.  Options of treatment were also discussed today over the phone.  These options would include primary surgical therapy versus radiation and androgen deprivation therapy given his Gleason score of 7 with 80% of pattern for involvement.  He understands also if he opts to proceed with primary surgical therapy warfarin will be need to be interrupted and subsequently he would be at slight increased risk for postoperative deep vein thrombosis.  All in all he is favoring proceeding with radiation therapy over surgery.  He will have  discussion with Dr. Tammi Klippel as well as Dr. Alinda Money later on today.  All his questions were answered to his satisfaction.  Follow Up Instructions:    I discussed the assessment and treatment plan with the patient. The patient was provided an opportunity to ask questions and all were answered. The patient agreed with the plan and demonstrated an understanding of the instructions.   The patient was advised to call back or seek an in-person evaluation if any issues arise in the future.  I provided 20 minutes of non-face-to-face time during this encounter.   Zola Button, MD

## 2019-01-20 NOTE — Progress Notes (Signed)
                               Care Plan Summary- Telephone Encounter Visit ( due to [id number])  Name: Mr. Aylan Bayona DOB: 1950-11-30   Your Medical Team:   Urologist -  Dr. Raynelle Bring, [facility]  Radiation Oncologist - Dr. Tyler Pita, [facility]   Medical Oncologist - Dr. Zola Button, [place]  Recommendations: 1) Brachytherapy     * These recommendations are based on information available as of today's consult.      Recommendations may change depending on the results of further tests or exams.  Next Steps: 1) Enid Derry from Dr. Johny Shears office will contact you to schedule seed implant     When appointments need to be scheduled, you will be contacted by [facility] and/or [facility].  Questions?  Please do not hesitate to call Cira Rue, RN, BSN, OCN at [phone] any questions or concerns.  Shirlean Mylar is your Oncology Nurse Navigator and is available to assist you while you're receiving your medical care at [facility].

## 2019-01-20 NOTE — Consult Note (Signed)
Multi-Disciplinary Clinic     01/20/2019   --------------------------------------------------------------------------------   Juan [name]  MRN: [id number]  PRIMARY CARE:  Donald Prose  DOB: 17-Nov-1950, 68 year old Male  REFERRING:  Irine Seal, MD  SSN: -[id number]  PROVIDER:  Irine Seal, M.D.    TREATING:  Raynelle Bring, M.D.    LOCATION:  [facility] [phone]   --------------------------------------------------------------------------------   CC/HPI: CC: Prostate Cancer   Physician requesting consult: Dr. Irine Seal  PCP: Dr. Donald Prose  Location of consult: Telephone consultation   [name] is a 67 year old gentleman who was noted to have an elevated PSA of 13.36 during routine screening by his PCP. He was seen by Dr. Jeffie Pollock and his repeat PSA was 12.0 prompting a TRUS biopsy of the prostate on 12/08/18 that demonstrated Gleason 4+3=7 adenocarcinoma with 2 out of 12 biopsy cores positive for malignancy.   Family history: None.   Imaging studies: None.   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 He has previously seen Dr. [name] in the past. Due to his recurrent DVTs, he has remained on chronic anticoagulation. He was just recently diagnosed with skin cancer this was excised. His pathology is currently pending.  PSH: No abdominal surgeries.   TNM stage: cT1c Nx Mx  PSA: 12.0  Gleason score: 4+3=7  Biopsy (12/08/18): 2/12 cores positive  Left: L lateral base (10%, 3+3=6)  Right: R base (< 5%, 4+3=7)  Prostate volume: 46.6 cc   Nomogram  OC disease: 37%  EPE: 57%  SVI: 5%  LNI: 7%  PFS (5 year, 10 year): 62%, 46%   Urinary function: He denies significant bothersome urinary symptoms.  Erectile function: He does have mild to moderate erectile dysfunction but can obtain erections partially.     ALLERGIES: No Allergies    MEDICATIONS: Coumadin 10 mg tablet  Biotin  Cortisone 1 % cream     GU PSH:  Prostate Needle Biopsy - 12/08/2018    NON-GU PSH: Surgical Pathology, Gross And Microscopic Examination For Prostate Needle - 12/08/2018    GU PMH: Prostate Cancer, T1c Nx Mx Gleason 7(4+3) prostate cancer with moderate LUTS and ED. I don't think he is a good candidate for AS, Cryo or HIFU. I discussed RALP, EXRT w/wo ADT and Brachytherapy. I will have him seen in the multidisciplinary clinic to help develop his treatment plan. He is on chronic anticoagulation which would increase his risk of bleeding complications with radiation therapy but also the risk of surgical complications with RALP. We discussed the implications of this. - 01/07/2019 BPH w/LUTS, He has mild LUTS. - 10/28/2018 Elevated PSA, He has an elevated PSA with a benign exam. I am going to repeat a PSA and get a testosterone level today since he does have some issues with his libido. If the PSA remains elevated, I will have him return for a prostate [place] and biopsy. I have reviewed the risks of bleeding, infection and voiding difficulty. He will need to be off of his warfarin prior to the procedure as per routine but can resume the day after. If the PSA has declined significantly, I will monitor it frequently. - 10/28/2018 Urinary Urgency - 10/28/2018    NON-GU PMH: DVT, History Pulmonary Embolism, History    FAMILY HISTORY: No Family History    SOCIAL HISTORY: Marital Status: Married Preferred Language: English; Race: White Current Smoking Status: Patient has never smoked.   Tobacco Use Assessment  Completed: Used Tobacco in last 30 days? Drinks 3 caffeinated drinks per day.    REVIEW OF SYSTEMS:    GU Review Male:   Patient denies frequent urination, hard to postpone urination, burning/ pain with urination, get up at night to urinate, leakage of urine, stream starts and stops, trouble starting your streams, and have to strain to urinate .  Gastrointestinal (Lower):   Patient denies diarrhea and constipation.  Gastrointestinal  (Upper):   Patient denies nausea and vomiting.  Constitutional:   Patient denies fever, night sweats, weight loss, and fatigue.  Skin:   Patient denies skin rash/ lesion and itching.  Eyes:   Patient denies blurred vision and double vision.  Ears/ Nose/ Throat:   Patient denies sore throat and sinus problems.  Hematologic/Lymphatic:   Patient denies swollen glands and easy bruising.  Cardiovascular:   Patient denies leg swelling and chest pains.  Respiratory:   Patient denies cough and shortness of breath.  Endocrine:   Patient denies excessive thirst.  Musculoskeletal:   Patient denies back pain and joint pain.  Neurological:   Patient denies headaches and dizziness.  Psychologic:   Patient denies depression and anxiety.   VITAL SIGNS: None   PAST DATA REVIEWED:  Source Of History:  Patient  Lab Test Review:   PSA  Records Review:   Pathology Reports   10/28/18  PSA  Total PSA 12.00 ng/mL    10/28/18  Hormones  Testosterone, Total 363.3 ng/dL    PROCEDURES: None   ASSESSMENT:      ICD-10 Details  1 GU:   Prostate Cancer - C61      PLAN:           Document Letter(s):  Created for Patient: Clinical Summary         Notes:   1. Prostate cancer: As the in person multidisciplinary Clinic was canceled today due to COVID-19, we proceeded with a telephone consultation. He had previously discussed his situation in detail with Dr. [name] and Dr. Tammi Klippel prior to our discussion. The patient was counseled about the natural history of prostate cancer and the standard treatment options that are available for prostate cancer. It was explained to him how his age and life expectancy, clinical stage, Gleason score, and PSA affect his prognosis, the decision to proceed with additional staging studies, as well as how that information influences recommended treatment strategies. We discussed the roles for active surveillance, radiation therapy, surgical therapy, androgen deprivation, as well as  ablative therapy options for the treatment of prostate cancer as appropriate to his individual cancer situation. We discussed the risks and benefits of these options with regard to their impact on cancer control and also in terms of potential adverse events, complications, and impact on quality of life particularly related to urinary and sexual function. The patient was encouraged to ask questions throughout the discussion today and all questions were answered to his stated satisfaction. In addition, the patient was provided with and/or directed to appropriate resources and literature for further education about prostate cancer and treatment options.   Currently, he appears to be leaning toward a radiation seed implant for treatment. After discussion, he would like to proceed in that direction. I will notify Dr. Jeffie Pollock so that he and Dr. Tammi Klippel can work on scheduling him. It is likely that he wishes to proceed later in the spring or early summer considering COVID-19.   CC: Dr. Irine Seal  Dr. Donald Prose  Dr. Tyler Pita  Dr.  Zola Button

## 2019-01-20 NOTE — Progress Notes (Signed)
Radiation Oncology         [phone] ________________________________  Multidisciplinary Prostate Cancer Clinic  Initial Radiation Oncology Consultation - Conducted via telephone due to current COVID-19 concerns for limiting Juan exposure  Name: Juan Juan MRN: [id number]  Date: 01/20/2019  DOB: 06/27/1951  XL:[id number], Juan Juan, Juan Juan, Juan  REFERRING PHYSICIAN: Irine [name], Juan  DIAGNOSIS: 68 y.o. gentleman with stage T1c adenocarcinoma of the prostate with a Gleason's score of 4+3 and a PSA of 12.0    ICD-10-CM   1. Malignant neoplasm of prostate ([place]) C61     HISTORY OF PRESENT ILLNESS::Juan Juan is a 68 y.o. gentleman with a diagnosis of prostate cancer.  He was noted to have an elevated PSA of 13.36 by his primary care physician, Dr. Donald [name].  His last prior PSA was 2.14 in 2003 and 2.52 in 2002 when he was being seen for kidney stones by Dr. Serita [name].  Accordingly, he was referred for evaluation in urology to Dr. Jeffie [name] on 10/28/2018, and a digital rectal examination was performed revealing no prostate nodules.  Repeat PSA at that time was 12.00.  The Juan proceeded to transrectal ultrasound with 12 biopsies of the prostate on 12/08/2018.  The prostate volume measured 46.58 cc.  Out of 12 core biopsies, 2 were positive.  The maximum Gleason score was 4+3, and this was seen in the right base.  Additionally, there was Gleason 3+3 disease seen in the left base lateral.  Biopsies of prostate revealed:   The Juan reviewed the biopsy results with his urologist and he has kindly been referred today to the multidisciplinary prostate cancer clinic for presentation of pathology and radiology studies in our conference for discussion of potential radiation treatment options and clinical evaluation.   Of note, the Juan has been on chronic warfarin for about 4 years for history of DVT's and PE's. He states that he was able to discontinue  the warfarin and successfully bridged with Juan [name] injections prior to his recent prostate biopsy.  PREVIOUS RADIATION THERAPY: No  PAST MEDICAL HISTORY:  has a past medical history of Kidney stones, Pulmonary embolism ([place]) (05/26/2013), Right leg DVT ([place]) (05/26/2013), and Shortness of breath.    PAST SURGICAL HISTORY: Past Surgical History:  Procedure Laterality Date   KNEE ARTHROSCOPY W/ ACL RECONSTRUCTION Right 1980's   LITHOTRIPSY  1990's    FAMILY HISTORY: family history is not on file.  SOCIAL HISTORY:  reports that he has never smoked. He has never used smokeless tobacco. He reports current alcohol use. He reports that he does not use drugs.   ALLERGIES: Juan has no known allergies.  MEDICATIONS:  Current Outpatient Medications  Medication Sig Dispense Refill   enoxaparin (Juan [name]) 80 MG/0.8ML injection Inject 1.6 mLs (160 mg total) into the skin daily. 7 Syringe 1   warfarin (COUMADIN) 5 MG tablet Take 1 tablet (5 mg total) by mouth daily. 30 tablet 1   No current facility-administered medications for this encounter.     REVIEW OF SYSTEMS:  On review of systems, the Juan reports that he is doing well overall. He denies any chest pain, shortness of breath, cough, fevers, chills, night sweats, or unintended weight changes. He denies any bowel disturbances, and denies abdominal pain, nausea or vomiting. He denies any new musculoskeletal or joint aches or pains. The Juan mentions he had a skin lesion removed last week with his dermatologist, Juan Juan, Juan which was felt to be suspicious for  melanoma. He admits his urinary tract symptoms were slightly exaggerated on his recent form that he filled out with Dr. Jeffie [name]. He does have some increased frequency and urgency during the day but denies hesitancy, straining to void or feelings of incomplete bladder emptying. He denies nocturia if he refrains from drinking excess fluids in the evening. A complete review of systems is  obtained and is otherwise negative.   PHYSICAL EXAM: Deferred due to telephone consult.  Wt Readings from Last 3 Encounters:  09/30/15 230 lb (104.3 kg)  12/31/13 238 lb 8 oz (108.2 kg)  11/27/13 243 lb 8 oz (110.5 kg)   Temp Readings from Last 3 Encounters:  10/01/15 98.6 F (37 C)  12/31/13 97.4 F (36.3 C) (Oral)  11/27/13 98.6 F (37 C) (Oral)   BP Readings from Last 3 Encounters:  10/01/15 97/64  12/31/13 114/79  11/27/13 125/79   Pulse Readings from Last 3 Encounters:  10/01/15 67  12/31/13 63  11/27/13 71     LABORATORY DATA:  Lab Results  Component Value Date   WBC 8.9 10/01/2015   HGB 14.0 10/01/2015   HCT 41.9 10/01/2015   MCV 89.3 10/01/2015   PLT 157 10/01/2015   Lab Results  Component Value Date   NA 139 10/01/2015   K 4.2 10/01/2015   CL 106 10/01/2015   CO2 24 10/01/2015   Lab Results  Component Value Date   ALT 16 (L) 10/01/2015   AST 24 10/01/2015   ALKPHOS 92 10/01/2015   BILITOT 1.0 10/01/2015     RADIOGRAPHY: No results found.    IMPRESSION/PLAN: 68 y.o. gentleman with Stage T1c adenocarcinoma of the prostate with a PSA of 12.0 and a Gleason score of 4+3.    We discussed the Juan's workup and outlined the nature of prostate cancer in this setting. The Juan's T stage, Gleason's score, and PSA put him into the unfavorable-intermediate risk group. Accordingly, he is eligible for a variety of potential treatment options including radical prostatectomy, brachytherapy, or 5.5 weeks of external radiation. We discussed the available radiation techniques, and focused on the details and logistics and delivery. We discussed and outlined the risks, benefits, short and long-term effects associated with radiotherapy and compared and contrasted these with prostatectomy. We discussed the role of SpaceOAR in reducing the rectal toxicity associated with radiotherapy.  At the end of the conversation the Juan is leaning towards brachytherapy and use  of SpaceOAR to reduce rectal toxicity from radiotherapy.  We will share our discussion with Dr. Jeffie [name] and move forward with scheduling his [facility] planning appointment in the near future but discussed that due to COVID-19, any radiation planning and/or procedures may be delayed. The Juan voiced understanding and is comfortable with this plan. Romie Jumper in our office will be working closely with him to coordinate OR scheduling and pre and post procedure appointments. Due to his chronic anticoagulation, he will need Juan [name] bridging for 5 days prior to his procedure. We will contact the pharmaceutical rep to ensure that [place] is available at the time of procedure.  He will have a prostate MRI following his post-seed CT SIM to confirm appropriate distribution of the [place].  This encounter was conducted by telephone. The Juan has given verbal consent for this type of encounter. The time spent during this encounter was 45 minutes and 50% of that time was spent in coordination of the Juan's care. The attendants for this meeting include Juan Juan, Juan Juan, scribe [place]  Cindee Lame, and Juan Juan.  During the encounter, Juan Juan, Juan Juan, and scribe Clinton Sawyer were located at [facility] Radiation Oncology Department.  Juan Juan Juan was located at home.      Nicholos Johns, Juan    Juan Pita, Juan  [facility] Direct Dial: [phone]   Fax: [phone] [email or web address]   Skype   LinkedIn  This document serves as a record of services personally performed by Juan Pita, Juan and Freeman Caldron, Juan. It was created on their behalf by Rae Lips, a trained medical scribe. The creation of this record is based on the scribe's personal observations and the providers' statements to them. This document has been checked and approved by the attending providers.

## 2019-01-21 ENCOUNTER — Encounter: Payer: Self-pay | Admitting: General Practice

## 2019-01-21 ENCOUNTER — Telehealth: Payer: Self-pay | Admitting: Oncology

## 2019-01-21 NOTE — Telephone Encounter (Signed)
No 3/24 los

## 2019-01-21 NOTE — Progress Notes (Signed)
[place] Spiritual Care Note  LVM to introduce Patient and [facility] as follow-up to [facility], encouraging callback. Will also send packet of [place] information.   [place], [place], [facility] Pager [phone] Voicemail [phone]

## 2019-01-22 DIAGNOSIS — C4361 Malignant melanoma of right upper limb, including shoulder: Secondary | ICD-10-CM | POA: Diagnosis not present

## 2019-01-22 DIAGNOSIS — D0361 Melanoma in situ of right upper limb, including shoulder: Secondary | ICD-10-CM | POA: Diagnosis not present

## 2019-01-22 DIAGNOSIS — C61 Malignant neoplasm of prostate: Secondary | ICD-10-CM | POA: Insufficient documentation

## 2019-01-23 ENCOUNTER — Telehealth: Payer: Self-pay | Admitting: *Deleted

## 2019-01-23 NOTE — Telephone Encounter (Signed)
CALLED PATIENT TO ASK SOME QUESTIONS, LVM FOR A RETURN CALL

## 2019-02-03 ENCOUNTER — Telehealth: Payer: Self-pay | Admitting: Medical Oncology

## 2019-02-03 ENCOUNTER — Telehealth: Payer: Self-pay | Admitting: *Deleted

## 2019-02-03 NOTE — Telephone Encounter (Signed)
Returned patient's phone call, lvm for a return call 

## 2019-02-03 NOTE — Telephone Encounter (Signed)
Left message as follow up to [facility]. He has chosen brachytherapy as treatment for his prostate cancer. I asked him to call me with questions or concern.

## 2019-02-04 ENCOUNTER — Encounter: Payer: Self-pay | Admitting: *Deleted

## 2019-02-20 ENCOUNTER — Other Ambulatory Visit: Payer: Self-pay | Admitting: Urology

## 2019-03-18 ENCOUNTER — Other Ambulatory Visit: Payer: Self-pay | Admitting: *Deleted

## 2019-03-18 NOTE — Patient Outreach (Signed)
[facility]) Care Management  03/18/2019  Juan [name] 11-May-1951 [id number]  Follow up from [facility] previous screening outreach/ phone calls. Unsuccessful outreach attempts placed to patient on 01/29/2019 and 02/04/2019; unsuccessful patient letter mailed to patient on 02/04/2019 without patient call back to date.    Patient to be followed in [facility] engaged program.   Plan:  Will re-attempt call to patient within 2 months of initial unsuccessful outreach attempts previously made  Oneta Rack, Therapist, sports, BSN, [employer] Coordinator [facility] Care Management  [phone]

## 2019-03-31 ENCOUNTER — Ambulatory Visit: Payer: Self-pay | Admitting: *Deleted

## 2019-04-06 DIAGNOSIS — I2699 Other pulmonary embolism without acute cor pulmonale: Secondary | ICD-10-CM | POA: Diagnosis not present

## 2019-04-06 DIAGNOSIS — Z7901 Long term (current) use of anticoagulants: Secondary | ICD-10-CM | POA: Diagnosis not present

## 2019-04-06 NOTE — Progress Notes (Signed)
  Radiation Oncology         [phone] ________________________________  Name: RIO KIDANE MRN: [id number]  Date: 04/09/2019  DOB: 07-Mar-1951  SIMULATION AND TREATMENT PLANNING NOTE PUBIC ARCH STUDY  LA:GTXMIWOEH, [place], Christin Fudge, MD  DIAGNOSIS: 68 y.o. gentleman with stage T1c adenocarcinoma of the prostate with a Gleason's score of 4+3 and a PSA of 12.0     ICD-10-CM   1. Malignant neoplasm of prostate ([place])  C61     COMPLEX SIMULATION:  The patient presented today for evaluation for possible prostate seed implant. He was brought to the radiation planning suite and placed supine on the CT couch. A 3-dimensional image study set was obtained in upload to the planning computer. There, on each axial slice, I contoured the prostate gland. Then, using three-dimensional radiation planning tools I reconstructed the prostate in view of the structures from the transperineal needle pathway to assess for possible pubic arch interference. In doing so, I did not appreciate any pubic arch interference. Also, the patient's prostate volume was estimated based on the drawn structure. The volume was 46 cc.  His previous TRUS volume was 46.58 cc.  Given the pubic arch appearance and prostate volume, patient remains a good candidate to proceed with prostate seed implant. Today, he freely provided informed written consent to proceed.    PLAN: The patient will undergo prostate seed implant.   ________________________________  Sheral Apley. Tammi Klippel, M.D.

## 2019-04-08 ENCOUNTER — Telehealth: Payer: Self-pay | Admitting: *Deleted

## 2019-04-08 NOTE — Telephone Encounter (Signed)
Called patient to remind of pre-seed appts. and chest x-ray and EKG for 04-09-19, spoke with patient and he is aware of these appts.

## 2019-04-09 ENCOUNTER — Ambulatory Visit
Admission: RE | Admit: 2019-04-09 | Discharge: 2019-04-09 | Disposition: A | Discharge status: Home / Self Care | Payer: PPO | Source: Ambulatory Visit | Attending: Urology | Admitting: Urology

## 2019-04-09 ENCOUNTER — Encounter (HOSPITAL_COMMUNITY)
Admission: RE | Admit: 2019-04-09 | Discharge: 2019-04-09 | Disposition: A | Discharge status: Home / Self Care | Payer: PPO | Source: Ambulatory Visit | Attending: Urology | Admitting: Urology

## 2019-04-09 ENCOUNTER — Ambulatory Visit
Admission: RE | Admit: 2019-04-09 | Discharge: 2019-04-09 | Disposition: A | Discharge status: Home / Self Care | Payer: MEDICAID | Source: Ambulatory Visit | Attending: Urology | Admitting: Urology

## 2019-04-09 ENCOUNTER — Ambulatory Visit (HOSPITAL_COMMUNITY)
Admission: RE | Admit: 2019-04-09 | Discharge: 2019-04-09 | Disposition: A | Discharge status: Home / Self Care | Payer: PPO | Source: Ambulatory Visit | Attending: Urology | Admitting: Urology

## 2019-04-09 ENCOUNTER — Other Ambulatory Visit: Payer: Self-pay

## 2019-04-09 DIAGNOSIS — C61 Malignant neoplasm of prostate: Secondary | ICD-10-CM | POA: Insufficient documentation

## 2019-04-09 DIAGNOSIS — R972 Elevated prostate specific antigen [PSA]: Secondary | ICD-10-CM | POA: Diagnosis not present

## 2019-04-09 DIAGNOSIS — N429 Disorder of prostate, unspecified: Secondary | ICD-10-CM | POA: Diagnosis not present

## 2019-04-09 DIAGNOSIS — Z01818 Encounter for other preprocedural examination: Secondary | ICD-10-CM | POA: Diagnosis not present

## 2019-04-10 ENCOUNTER — Other Ambulatory Visit: Payer: Self-pay | Admitting: Urology

## 2019-04-10 DIAGNOSIS — C61 Malignant neoplasm of prostate: Secondary | ICD-10-CM

## 2019-04-16 ENCOUNTER — Other Ambulatory Visit: Payer: Self-pay | Admitting: *Deleted

## 2019-04-16 NOTE — Patient Outreach (Signed)
[facility]) Care Management  04/16/2019  ARIYAN BRISENDINE 1951-08-19 [id number]  [facility] Care Management Case Closure note from previously placed [facility] screening phone call  Received confirmation from [place] team that patient is now active with an external program  Plan:  Will close patient case and make patient inactive with [facility] CM  Oneta Rack, RN, BSN, [place] Coordinator [facility] Care Management  [phone]

## 2019-04-28 DIAGNOSIS — C61 Malignant neoplasm of prostate: Secondary | ICD-10-CM | POA: Diagnosis not present

## 2019-05-05 DIAGNOSIS — Z7901 Long term (current) use of anticoagulants: Secondary | ICD-10-CM | POA: Diagnosis not present

## 2019-05-05 DIAGNOSIS — I2699 Other pulmonary embolism without acute cor pulmonale: Secondary | ICD-10-CM | POA: Diagnosis not present

## 2019-05-07 ENCOUNTER — Telehealth: Payer: Self-pay | Admitting: *Deleted

## 2019-05-07 NOTE — Telephone Encounter (Signed)
Called patient to inform of lab appt. for 05-08-19 - arrival time- 10:45 am @ [facility] Admitting, lvm for a return call

## 2019-05-08 ENCOUNTER — Other Ambulatory Visit (HOSPITAL_COMMUNITY)
Admission: RE | Admit: 2019-05-08 | Discharge: 2019-05-08 | Disposition: A | Discharge status: Home / Self Care | Payer: PPO | Source: Ambulatory Visit | Attending: Urology | Admitting: Urology

## 2019-05-08 ENCOUNTER — Other Ambulatory Visit: Payer: Self-pay

## 2019-05-08 ENCOUNTER — Encounter (HOSPITAL_COMMUNITY)
Admission: RE | Admit: 2019-05-08 | Discharge: 2019-05-08 | Disposition: A | Discharge status: Home / Self Care | Payer: PPO | Source: Ambulatory Visit | Attending: Urology | Admitting: Urology

## 2019-05-08 DIAGNOSIS — Z7901 Long term (current) use of anticoagulants: Secondary | ICD-10-CM | POA: Diagnosis not present

## 2019-05-08 DIAGNOSIS — Z86718 Personal history of other venous thrombosis and embolism: Secondary | ICD-10-CM | POA: Diagnosis not present

## 2019-05-08 DIAGNOSIS — Z85828 Personal history of other malignant neoplasm of skin: Secondary | ICD-10-CM | POA: Diagnosis not present

## 2019-05-08 DIAGNOSIS — C61 Malignant neoplasm of prostate: Secondary | ICD-10-CM | POA: Diagnosis not present

## 2019-05-08 DIAGNOSIS — Z01812 Encounter for preprocedural laboratory examination: Secondary | ICD-10-CM | POA: Insufficient documentation

## 2019-05-08 DIAGNOSIS — Z86711 Personal history of pulmonary embolism: Secondary | ICD-10-CM | POA: Diagnosis not present

## 2019-05-08 DIAGNOSIS — Z1159 Encounter for screening for other viral diseases: Secondary | ICD-10-CM | POA: Diagnosis not present

## 2019-05-08 LAB — CBC
HCT: 40.2 % (ref 39.0–52.0)
Hemoglobin: 13.1 g/dL (ref 13.0–17.0)
MCH: 30.1 pg (ref 26.0–34.0)
MCHC: 32.6 g/dL (ref 30.0–36.0)
MCV: 92.4 fL (ref 80.0–100.0)
Platelets: 189 10*3/uL (ref 150–400)
RBC: 4.35 MIL/uL (ref 4.22–5.81)
RDW: 13.5 % (ref 11.5–15.5)
WBC: 6.4 10*3/uL (ref 4.0–10.5)
nRBC: 0 % (ref 0.0–0.2)

## 2019-05-08 LAB — COMPREHENSIVE METABOLIC PANEL
ALT: 16 U/L (ref 0–44)
AST: 20 U/L (ref 15–41)
Albumin: 3.9 g/dL (ref 3.5–5.0)
Alkaline Phosphatase: 73 U/L (ref 38–126)
Anion gap: 8 (ref 5–15)
BUN: 16 mg/dL (ref 8–23)
CO2: 22 mmol/L (ref 22–32)
Calcium: 8.8 mg/dL — ABNORMAL LOW (ref 8.9–10.3)
Chloride: 110 mmol/L (ref 98–111)
Creatinine, Ser: 1.12 mg/dL (ref 0.61–1.24)
GFR calc Af Amer: >60 mL/min (ref >60)
GFR calc non Af Amer: >60 mL/min (ref >60)
Glucose, Bld: 85 mg/dL (ref 70–99)
Potassium: 4 mmol/L (ref 3.5–5.1)
Sodium: 140 mmol/L (ref 135–145)
Total Bilirubin: 0.7 mg/dL (ref 0.3–1.2)
Total Protein: 6.8 g/dL (ref 6.5–8.1)

## 2019-05-08 LAB — SARS CORONAVIRUS 2 (TAT 6-24 HRS): SARS Coronavirus 2: NEGATIVE

## 2019-05-08 LAB — PROTIME-INR
INR: 1.9 — ABNORMAL HIGH (ref 0.8–1.2)
Prothrombin Time: 21.1 seconds — ABNORMAL HIGH (ref 11.4–15.2)

## 2019-05-08 LAB — APTT: aPTT: 43 seconds — ABNORMAL HIGH (ref 24–36)

## 2019-05-11 ENCOUNTER — Telehealth: Payer: Self-pay | Admitting: *Deleted

## 2019-05-11 ENCOUNTER — Encounter (HOSPITAL_BASED_OUTPATIENT_CLINIC_OR_DEPARTMENT_OTHER): Payer: Self-pay

## 2019-05-11 ENCOUNTER — Other Ambulatory Visit: Payer: Self-pay

## 2019-05-11 NOTE — H&P (Signed)
CC/HPI: Pt presents today for pre-operative history and physical exam in anticipation of brachytherapy and space oar on 05/12/19 by Dr. Jeffie Pollock. He is doing well and is without complaint.  Pt denies F/C, HA, CP, SOB, N/V, diarrhea/constipation, back pain, flank pain, hematuria, and dysuria.    HX:   CC: Prostate Cancer   Physician requesting consult: Dr. Irine Seal  PCP: Dr. Donald Prose  Location of consult: Telephone consultation   Mr. Westenberger is a 68 year old gentleman who was noted to have an elevated PSA of 13.36 during routine screening by his PCP. He was seen by Dr. Jeffie Pollock and his repeat PSA was 12.0 prompting a TRUS biopsy of the prostate on 12/08/18 that demonstrated Gleason 4+3=7 adenocarcinoma with 2 out of 12 biopsy cores positive for malignancy.   Family history: None.   Imaging studies: None.   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 He has previously seen Dr. [name] in the past. Due to his recurrent DVTs, he has remained on chronic anticoagulation. He was just recently diagnosed with skin cancer this was excised. His pathology is currently pending.  PSH: No abdominal surgeries.   TNM stage: cT1c Nx Mx  PSA: 12.0  Gleason score: 4+3=7  Biopsy (12/08/18): 2/12 cores positive  Left: L lateral base (10%, 3+3=6)  Right: R base (< 5%, 4+3=7)  Prostate volume: 46.6 cc   Nomogram  OC disease: 37%  EPE: 57%  SVI: 5%  LNI: 7%  PFS (5 year, 10 year): 62%, 46%   Urinary function: He denies significant bothersome urinary symptoms.  Erectile function: He does have mild to moderate erectile dysfunction but can obtain erections partially.     ALLERGIES: None   MEDICATIONS: Coumadin 10 mg tablet  Cortisone 1 % cream     GU PSH: Prostate Needle Biopsy - 12/08/2018       PSH Notes: right knee repair  lithotripsy >30 years ago      NON-GU PSH: Surgical Pathology, Gross And Microscopic Examination For Prostate Needle - 12/08/2018      GU PMH: Prostate Cancer, T1c Nx Mx Gleason 7(4+3) prostate cancer with moderate LUTS and ED. I don't think he is a good candidate for AS, Cryo or HIFU. I discussed RALP, EXRT w/wo ADT and Brachytherapy. I will have him seen in the multidisciplinary clinic to help develop his treatment plan. He is on chronic anticoagulation which would increase his risk of bleeding complications with radiation therapy but also the risk of surgical complications with RALP. We discussed the implications of this. - 01/07/2019 BPH w/LUTS, He has mild LUTS. - 10/28/2018 Elevated PSA, He has an elevated PSA with a benign exam. I am going to repeat a PSA and get a testosterone level today since he does have some issues with his libido. If the PSA remains elevated, I will have him return for a prostate [place] and biopsy. I have reviewed the risks of bleeding, infection and voiding difficulty. He will need to be off of his warfarin prior to the procedure as per routine but can resume the day after. If the PSA has declined significantly, I will monitor it frequently. 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 Ronnald Ramp   NON-GU PMH: DVT, History Pulmonary Embolism, History    FAMILY HISTORY: None   SOCIAL HISTORY: Marital Status: Married Preferred Language: English; Race: White Current Smoking Status: Patient has never smoked.  Tobacco Use Assessment Completed: Used Tobacco in last 30 days? Does not use smokeless tobacco. Does drink.  Does not use drugs. Drinks 3 caffeinated drinks per day. Has not had a blood transfusion.     Notes: Rare ETOH   REVIEW OF SYSTEMS:    GU Review Male:   Patient denies frequent urination, hard to postpone urination, burning/ pain with urination, get up at night to urinate, leakage of urine, stream starts and stops, trouble starting your stream, have to strain to urinate , erection problems, and penile pain.  Gastrointestinal (Upper):    Patient denies nausea, vomiting, and indigestion/ heartburn.  Gastrointestinal (Lower):   Patient denies diarrhea and constipation.  Constitutional:   Patient denies fever, night sweats, weight loss, and fatigue.  Skin:   Patient denies skin rash/ lesion and itching.  Eyes:   Patient denies blurred vision and double vision.  Ears/ Nose/ Throat:   Patient denies sore throat and sinus problems.  Hematologic/Lymphatic:   Patient denies swollen glands and easy bruising.  Cardiovascular:   Patient denies leg swelling and chest pains.  Respiratory:   Patient denies cough and shortness of breath.  Endocrine:   Patient denies excessive thirst.  Musculoskeletal:   Patient denies joint pain and back pain.  Neurological:   Patient denies headaches and dizziness.  Psychologic:   Patient denies depression and anxiety.   VITAL SIGNS:      04/28/2019 01:10 PM  Weight 230 lb / 104.33 kg  Height 70 in / 177.8 cm  BP 124/81 mmHg  Pulse 69 /min  Temperature 97.9 F / 36.6 C  BMI 33.0 kg/m   MULTI-SYSTEM PHYSICAL EXAMINATION:    Constitutional: Well-nourished. No physical deformities. Normally developed. Good grooming.  Neck: Neck symmetrical, not swollen. Normal tracheal position.  Respiratory: Normal breath sounds. No labored breathing, no use of accessory muscles.   Cardiovascular: Regular rate and rhythm. No murmur, no gallop.   Lymphatic: No enlargement of neck, axillae, groin.  Skin: No paleness, no jaundice, no cyanosis. No lesion, no ulcer, no rash.  Neurologic / Psychiatric: Oriented to time, oriented to place, oriented to person. No depression, no anxiety, no agitation.  Gastrointestinal: No mass, no tenderness, no rigidity, non obese abdomen.  Eyes: Normal conjunctivae. Normal eyelids.  Ears, Nose, Mouth, and Throat: Left ear no scars, no lesions, no masses. Right ear no scars, no lesions, no masses. Nose no scars, no lesions, no masses. Normal hearing. Normal lips.  Musculoskeletal: Normal  gait and station of head and neck.     PAST DATA REVIEWED:  Source Of History:  Patient  Records Review:   Previous Patient Records  Urine Test Review:   Urinalysis   04/28/19  Urinalysis  Urine Appearance Clear   Urine Color Yellow   Urine Glucose Neg mg/dL  Urine Bilirubin Neg mg/dL  Urine Ketones Trace mg/dL  Urine Specific Gravity 1.025   Urine Blood 2+ ery/uL  Urine pH <=5.0   Urine Protein Neg mg/dL  Urine Urobilinogen 0.2 mg/dL  Urine Nitrites Neg   Urine Leukocyte Esterase Neg leu/uL  Urine WBC/hpf 0 - 5/hpf   Urine RBC/hpf 0 - 2/hpf   Urine Epithelial Cells NS (Not Seen)   Urine Bacteria Rare (0-9/hpf)   Urine Mucous Present   Urine Yeast NS (Not Seen)   Urine Trichomonas Not Present   Urine Cystals NS (Not Seen)   Urine Casts NS (Not Seen)   Urine Sperm Not Present    PROCEDURES:  Urinalysis w/Scope - 81001 Dipstick Dipstick Cont'd Micro  Color: Yellow Bilirubin: Neg mg/dL WBC/hpf: 0 - 5/hpf  Appearance: Clear Ketones: Trace mg/dL RBC/hpf: 0 - 2/hpf  Specific Gravity: 1.025 Blood: 2+ ery/uL Bacteria: Rare (0-9/hpf)  pH: <=5.0 Protein: Neg mg/dL Cystals: NS (Not Seen)  Glucose: Neg mg/dL Urobilinogen: 0.2 mg/dL Casts: NS (Not Seen)    Nitrites: Neg Trichomonas: Not Present    Leukocyte Esterase: Neg leu/uL Mucous: Present      Epithelial Cells: NS (Not Seen)      Yeast: NS (Not Seen)      Sperm: Not Present    ASSESSMENT:      ICD-10 Details  1 GU:   Prostate Cancer - C61    PLAN:           Schedule Return Visit/Planned Activity: Keep Scheduled Appointment - Schedule Surgery          Document Letter(s):  Created for Patient: Clinical Summary         Notes:   There are no changes in the patients history or physical exam since last evaluation by Dr. Jeffie Pollock. Pt is scheduled to undergo brachytherapy and space oar on 05/12/19.   He has been instructed to stop coumadin and use a lovenox bridge by Dr. Nancy Fetter.   All pt's questions were answered to  the best of my ability.          Next Appointment:      Next Appointment: 05/12/2019 09:30 AM    Appointment Type: Surgery     Location: [facility] [phone]    Provider: Irine Seal, M.D.    Reason for Visit: [place]

## 2019-05-11 NOTE — Telephone Encounter (Signed)
CALLED PATIENT TO REMIND OF IMPLANT FOR 05-12-19, SPOKE WITH PATIENT AND HE IS AWARE OF THIS PROCEDURE

## 2019-05-11 NOTE — Progress Notes (Signed)
Spoke with: Juan [name] NPO:  After Midnight, no gum, candy, or mints   Arrival time: 0730AM Labs: (CBC,CMP, PT, PTT, EKG, CXR CHART/Epic) AM medications:FLEET ENEMA (NONE) Pre op orders: YES Ride home: Juan [name] (wife) [phone]

## 2019-05-11 NOTE — Progress Notes (Signed)
SPOKE W/  Ronel     SCREENING SYMPTOMS OF COVID 19:   COUGH--NO  RUNNY NOSE--- NO  SORE THROAT---NO  NASAL CONGESTION----NO  SNEEZING----NO  SHORTNESS OF BREATH---NO  DIFFICULTY BREATHING---NO  TEMP >100.0 -----NO  UNEXPLAINED BODY ACHES------NO  CHILLS -------- NO  HEADACHES ---------NO  LOSS OF SMELL/ TASTE --------NO    HAVE YOU OR ANY FAMILY MEMBER TRAVELLED PAST 14 DAYS OUT OF THE   COUNTY---NO STATE----NO COUNTRY----NO  HAVE YOU OR ANY FAMILY MEMBER BEEN EXPOSED TO ANYONE WITH COVID 19? NO    

## 2019-05-12 ENCOUNTER — Ambulatory Visit (HOSPITAL_BASED_OUTPATIENT_CLINIC_OR_DEPARTMENT_OTHER): Payer: PPO | Admitting: Anesthesiology

## 2019-05-12 ENCOUNTER — Ambulatory Visit (HOSPITAL_BASED_OUTPATIENT_CLINIC_OR_DEPARTMENT_OTHER): Payer: PPO | Admitting: Physician Assistant

## 2019-05-12 ENCOUNTER — Other Ambulatory Visit: Payer: Self-pay

## 2019-05-12 ENCOUNTER — Ambulatory Visit (HOSPITAL_COMMUNITY): Payer: PPO

## 2019-05-12 ENCOUNTER — Ambulatory Visit (HOSPITAL_BASED_OUTPATIENT_CLINIC_OR_DEPARTMENT_OTHER)
Admission: RE | Admit: 2019-05-12 | Discharge: 2019-05-12 | Disposition: A | Discharge status: Home / Self Care | Payer: PPO | Attending: Urology | Admitting: Urology

## 2019-05-12 ENCOUNTER — Encounter (HOSPITAL_BASED_OUTPATIENT_CLINIC_OR_DEPARTMENT_OTHER): Payer: Self-pay

## 2019-05-12 ENCOUNTER — Encounter (HOSPITAL_BASED_OUTPATIENT_CLINIC_OR_DEPARTMENT_OTHER)
Admission: RE | Disposition: A | Discharge status: Home / Self Care | Payer: Self-pay | Source: Home / Self Care | Attending: Urology

## 2019-05-12 DIAGNOSIS — I82409 Acute embolism and thrombosis of unspecified deep veins of unspecified lower extremity: Secondary | ICD-10-CM | POA: Diagnosis not present

## 2019-05-12 DIAGNOSIS — Z86718 Personal history of other venous thrombosis and embolism: Secondary | ICD-10-CM | POA: Insufficient documentation

## 2019-05-12 DIAGNOSIS — Z86711 Personal history of pulmonary embolism: Secondary | ICD-10-CM | POA: Diagnosis not present

## 2019-05-12 DIAGNOSIS — Z85828 Personal history of other malignant neoplasm of skin: Secondary | ICD-10-CM | POA: Diagnosis not present

## 2019-05-12 DIAGNOSIS — Z7901 Long term (current) use of anticoagulants: Secondary | ICD-10-CM | POA: Insufficient documentation

## 2019-05-12 DIAGNOSIS — C61 Malignant neoplasm of prostate: Secondary | ICD-10-CM | POA: Insufficient documentation

## 2019-05-12 DIAGNOSIS — I2699 Other pulmonary embolism without acute cor pulmonale: Secondary | ICD-10-CM | POA: Diagnosis not present

## 2019-05-12 DIAGNOSIS — E785 Hyperlipidemia, unspecified: Secondary | ICD-10-CM | POA: Diagnosis not present

## 2019-05-12 HISTORY — PX: RADIOACTIVE SEED IMPLANT: SHX5150

## 2019-05-12 HISTORY — PX: CYSTOSCOPY: SHX5120

## 2019-05-12 HISTORY — PX: SPACE OAR INSTILLATION: SHX6769

## 2019-05-12 HISTORY — DX: Solitary pulmonary nodule: R91.1

## 2019-05-12 HISTORY — DX: Presence of spectacles and contact lenses: Z97.3

## 2019-05-12 HISTORY — DX: Personal history of urinary calculi: Z87.442

## 2019-05-12 SURGERY — INSERTION, RADIATION SOURCE, PROSTATE
Anesthesia: General | Site: Rectum

## 2019-05-12 MED ORDER — SODIUM CHLORIDE (PF) 0.9 % IJ SOLN
INTRAMUSCULAR | Status: DC | PRN
  Administered 2019-05-12: 10 mL

## 2019-05-12 MED ORDER — EPHEDRINE 5 MG/ML INJ
INTRAVENOUS | Status: AC
  Filled 2019-05-12: qty 10

## 2019-05-12 MED ORDER — DEXAMETHASONE SODIUM PHOSPHATE 10 MG/ML IJ SOLN
INTRAMUSCULAR | Status: DC | PRN
  Administered 2019-05-12: 10 mg via INTRAVENOUS

## 2019-05-12 MED ORDER — MEPERIDINE HCL 25 MG/ML IJ SOLN
6.2500 mg | INTRAMUSCULAR | Status: DC | PRN
  Filled 2019-05-12: qty 1

## 2019-05-12 MED ORDER — CIPROFLOXACIN IN D5W 400 MG/200ML IV SOLN
400.0000 mg | INTRAVENOUS | Status: AC
  Administered 2019-05-12: 400 mg via INTRAVENOUS
  Filled 2019-05-12: qty 200

## 2019-05-12 MED ORDER — ONDANSETRON HCL 4 MG/2ML IJ SOLN
INTRAMUSCULAR | Status: AC
  Filled 2019-05-12: qty 2

## 2019-05-12 MED ORDER — FENTANYL CITRATE (PF) 250 MCG/5ML IJ SOLN
INTRAMUSCULAR | Status: DC | PRN
  Administered 2019-05-12 (×3): 50 ug via INTRAVENOUS

## 2019-05-12 MED ORDER — ONDANSETRON HCL 4 MG/2ML IJ SOLN
INTRAMUSCULAR | Status: AC
  Filled 2019-05-12: qty 4

## 2019-05-12 MED ORDER — FENTANYL CITRATE (PF) 100 MCG/2ML IJ SOLN
25.0000 ug | INTRAMUSCULAR | Status: DC | PRN
  Administered 2019-05-12: 25 ug via INTRAVENOUS
  Filled 2019-05-12: qty 1

## 2019-05-12 MED ORDER — PHENYLEPHRINE 40 MCG/ML (10ML) SYRINGE FOR IV PUSH (FOR BLOOD PRESSURE SUPPORT)
PREFILLED_SYRINGE | INTRAVENOUS | Status: AC
  Filled 2019-05-12: qty 10

## 2019-05-12 MED ORDER — MORPHINE SULFATE (PF) 2 MG/ML IV SOLN
2.0000 mg | INTRAVENOUS | Status: DC | PRN
  Filled 2019-05-12: qty 1

## 2019-05-12 MED ORDER — LIDOCAINE 2% (20 MG/ML) 5 ML SYRINGE
INTRAMUSCULAR | Status: AC
  Filled 2019-05-12: qty 5

## 2019-05-12 MED ORDER — ONDANSETRON HCL 4 MG/2ML IJ SOLN
INTRAMUSCULAR | Status: DC | PRN
  Administered 2019-05-12: 4 mg via INTRAVENOUS

## 2019-05-12 MED ORDER — SODIUM CHLORIDE 0.9 % IV SOLN
250.0000 mL | INTRAVENOUS | Status: DC | PRN
  Filled 2019-05-12: qty 250

## 2019-05-12 MED ORDER — ACETAMINOPHEN 325 MG PO TABS
650.0000 mg | ORAL_TABLET | ORAL | Status: DC | PRN
  Filled 2019-05-12: qty 2

## 2019-05-12 MED ORDER — FENTANYL CITRATE (PF) 250 MCG/5ML IJ SOLN
INTRAMUSCULAR | Status: AC
  Filled 2019-05-12: qty 5

## 2019-05-12 MED ORDER — ROCURONIUM BROMIDE 10 MG/ML (PF) SYRINGE
PREFILLED_SYRINGE | INTRAVENOUS | Status: AC
  Filled 2019-05-12: qty 20

## 2019-05-12 MED ORDER — MIDAZOLAM HCL 2 MG/2ML IJ SOLN
INTRAMUSCULAR | Status: AC
  Filled 2019-05-12: qty 2

## 2019-05-12 MED ORDER — CIPROFLOXACIN IN D5W 400 MG/200ML IV SOLN
INTRAVENOUS | Status: AC
  Filled 2019-05-12: qty 200

## 2019-05-12 MED ORDER — FENTANYL CITRATE (PF) 100 MCG/2ML IJ SOLN
INTRAMUSCULAR | Status: AC
  Filled 2019-05-12: qty 2

## 2019-05-12 MED ORDER — OXYCODONE HCL 5 MG PO TABS
5.0000 mg | ORAL_TABLET | ORAL | Status: DC | PRN
  Filled 2019-05-12: qty 2

## 2019-05-12 MED ORDER — EPHEDRINE SULFATE-NACL 50-0.9 MG/10ML-% IV SOSY
PREFILLED_SYRINGE | INTRAVENOUS | Status: DC | PRN
  Administered 2019-05-12: 10 mg via INTRAVENOUS
  Administered 2019-05-12: 5 mg via INTRAVENOUS

## 2019-05-12 MED ORDER — PROPOFOL 10 MG/ML IV BOLUS
INTRAVENOUS | Status: AC
  Filled 2019-05-12: qty 20

## 2019-05-12 MED ORDER — SODIUM CHLORIDE 0.9% FLUSH
3.0000 mL | INTRAVENOUS | Status: DC | PRN
  Filled 2019-05-12: qty 3

## 2019-05-12 MED ORDER — LIDOCAINE 2% (20 MG/ML) 5 ML SYRINGE
INTRAMUSCULAR | Status: DC | PRN
  Administered 2019-05-12: 40 mg via INTRAVENOUS

## 2019-05-12 MED ORDER — LACTATED RINGERS IV SOLN
INTRAVENOUS | Status: DC
  Administered 2019-05-12: 50 mL/h via INTRAVENOUS
  Administered 2019-05-12: 11:00:00 via INTRAVENOUS
  Filled 2019-05-12: qty 1000

## 2019-05-12 MED ORDER — MIDAZOLAM HCL 2 MG/2ML IJ SOLN
0.5000 mg | Freq: Once | INTRAMUSCULAR | Status: DC | PRN
  Filled 2019-05-12: qty 2

## 2019-05-12 MED ORDER — SODIUM CHLORIDE 0.9% FLUSH
3.0000 mL | Freq: Two times a day (BID) | INTRAVENOUS | Status: DC
  Filled 2019-05-12: qty 3

## 2019-05-12 MED ORDER — DEXAMETHASONE SODIUM PHOSPHATE 10 MG/ML IJ SOLN
INTRAMUSCULAR | Status: AC
  Filled 2019-05-12: qty 2

## 2019-05-12 MED ORDER — LIDOCAINE 2% (20 MG/ML) 5 ML SYRINGE
INTRAMUSCULAR | Status: AC
  Filled 2019-05-12: qty 10

## 2019-05-12 MED ORDER — ACETAMINOPHEN 650 MG RE SUPP
650.0000 mg | RECTAL | Status: DC | PRN
  Filled 2019-05-12: qty 1

## 2019-05-12 MED ORDER — FLEET ENEMA 7-19 GM/118ML RE ENEM
1.0000 | ENEMA | Freq: Once | RECTAL | Status: DC
  Filled 2019-05-12: qty 1

## 2019-05-12 MED ORDER — IOHEXOL 300 MG/ML  SOLN
INTRAMUSCULAR | Status: DC | PRN
  Administered 2019-05-12: 3 mL

## 2019-05-12 MED ORDER — STERILE WATER FOR INJECTION IJ SOLN
INTRAMUSCULAR | Status: DC | PRN
  Administered 2019-05-12: 7 mL

## 2019-05-12 MED ORDER — TRAMADOL HCL 50 MG PO TABS: 50.0000 mg | ORAL_TABLET | Freq: Four times a day (QID) | ORAL | 0 refills | Status: AC | PRN

## 2019-05-12 MED ORDER — PROMETHAZINE HCL 25 MG/ML IJ SOLN
6.2500 mg | INTRAMUSCULAR | Status: DC | PRN
  Filled 2019-05-12: qty 1

## 2019-05-12 MED ORDER — MIDAZOLAM HCL 2 MG/2ML IJ SOLN
INTRAMUSCULAR | Status: DC | PRN
  Administered 2019-05-12: 2 mg via INTRAVENOUS

## 2019-05-12 MED ORDER — SUGAMMADEX SODIUM 500 MG/5ML IV SOLN
INTRAVENOUS | Status: AC
  Filled 2019-05-12: qty 5

## 2019-05-12 MED ORDER — DEXAMETHASONE SODIUM PHOSPHATE 10 MG/ML IJ SOLN
INTRAMUSCULAR | Status: AC
  Filled 2019-05-12: qty 1

## 2019-05-12 MED ORDER — PROPOFOL 10 MG/ML IV BOLUS
INTRAVENOUS | Status: DC | PRN
  Administered 2019-05-12: 200 mg via INTRAVENOUS

## 2019-05-12 SURGICAL SUPPLY — 46 items
BAG URINE DRAINAGE (UROLOGICAL SUPPLIES) ×5 IMPLANT
BLADE CLIPPER SENSICLIP SURGIC (BLADE) ×5 IMPLANT
CATH FOLEY 2WAY SLVR  5CC 16FR (CATHETERS) ×2
CATH FOLEY 2WAY SLVR 5CC 16FR (CATHETERS) ×3 IMPLANT
CATH ROBINSON RED A/P 16FR (CATHETERS) IMPLANT
CATH ROBINSON RED A/P 20FR (CATHETERS) ×5 IMPLANT
CLOTH BEACON ORANGE TIMEOUT ST (SAFETY) ×5 IMPLANT
CONT SPECI 4OZ STER CLIK (MISCELLANEOUS) ×10 IMPLANT
COVER BACK TABLE 60X90IN (DRAPES) ×5 IMPLANT
COVER MAYO STAND STRL (DRAPES) ×5 IMPLANT
COVER WAND RF STERILE (DRAPES) ×3 IMPLANT
DRSG TEGADERM 4X4.75 (GAUZE/BANDAGES/DRESSINGS) ×8 IMPLANT
DRSG TEGADERM 8X12 (GAUZE/BANDAGES/DRESSINGS) ×8 IMPLANT
GAUZE SPONGE 4X4 12PLY STRL (GAUZE/BANDAGES/DRESSINGS) ×2 IMPLANT
GLOVE BIO SURGEON STRL SZ 6 (GLOVE) IMPLANT
GLOVE BIO SURGEON STRL SZ 6.5 (GLOVE) IMPLANT
GLOVE BIO SURGEON STRL SZ7 (GLOVE) IMPLANT
GLOVE BIO SURGEON STRL SZ8 (GLOVE) ×2 IMPLANT
GLOVE BIO SURGEONS STRL SZ 6.5 (GLOVE)
GLOVE BIOGEL PI IND STRL 6 (GLOVE) IMPLANT
GLOVE BIOGEL PI IND STRL 6.5 (GLOVE) IMPLANT
GLOVE BIOGEL PI IND STRL 7.0 (GLOVE) IMPLANT
GLOVE BIOGEL PI IND STRL 8 (GLOVE) IMPLANT
GLOVE BIOGEL PI INDICATOR 6 (GLOVE)
GLOVE BIOGEL PI INDICATOR 6.5 (GLOVE)
GLOVE BIOGEL PI INDICATOR 7.0 (GLOVE)
GLOVE BIOGEL PI INDICATOR 8 (GLOVE)
GLOVE ECLIPSE 8.0 STRL XLNG CF (GLOVE) ×5 IMPLANT
GLOVE SURG SS PI 8.0 STRL IVOR (GLOVE) ×10 IMPLANT
GOWN STRL REUS W/ TWL XL LVL3 (GOWN DISPOSABLE) ×3 IMPLANT
GOWN STRL REUS W/TWL XL LVL3 (GOWN DISPOSABLE) ×10 IMPLANT
HOLDER FOLEY CATH W/STRAP (MISCELLANEOUS) ×5 IMPLANT
I-Seed AgX100 ×138 IMPLANT
IMPL SPACEOAR SYSTEM 10ML (Spacer) ×3 IMPLANT
IMPLANT SPACEOAR SYSTEM 10ML (Spacer) ×5 IMPLANT
IV NS 1000ML (IV SOLUTION) ×5
IV NS 1000ML BAXH (IV SOLUTION) ×3 IMPLANT
KIT TURNOVER CYSTO (KITS) ×5 IMPLANT
MARKER SKIN DUAL TIP RULER LAB (MISCELLANEOUS) ×5 IMPLANT
PACK CYSTO (CUSTOM PROCEDURE TRAY) ×5 IMPLANT
SURGILUBE 2OZ TUBE FLIPTOP (MISCELLANEOUS) ×2 IMPLANT
SUT BONE WAX W31G (SUTURE) IMPLANT
SYR 10ML LL (SYRINGE) IMPLANT
UNDERPAD 30X30 (UNDERPADS AND DIAPERS) ×10 IMPLANT
WATER STERILE IRR 3000ML UROMA (IV SOLUTION) ×5 IMPLANT
WATER STERILE IRR 500ML POUR (IV SOLUTION) ×5 IMPLANT

## 2019-05-12 NOTE — Anesthesia Procedure Notes (Signed)
Procedure Name: LMA Insertion Date/Time: 05/12/2019 10:23 AM Performed by: Cynda Familia, CRNA Pre-anesthesia Checklist: Patient identified, Emergency Drugs available, Suction available and Patient being monitored Patient Re-evaluated:Patient Re-evaluated prior to induction Oxygen Delivery Method: Circle System Utilized Preoxygenation: Pre-oxygenation with 100% oxygen Induction Type: IV induction Ventilation: Mask ventilation without difficulty LMA: LMA inserted LMA Size: 4.0 Number of attempts: 1 Placement Confirmation: positive ETCO2 Tube secured with: Tape Dental Injury: Teeth and Oropharynx as per pre-operative assessment

## 2019-05-12 NOTE — Anesthesia Preprocedure Evaluation (Addendum)
Anesthesia Evaluation  Patient identified by MRN, date of birth, ID band Patient awake    Reviewed: Allergy & Precautions, NPO status , Patient's Chart, lab work & pertinent test results  History of Anesthesia Complications Negative for: history of anesthetic complications  Airway Mallampati: II  TM Distance: >3 FB Neck ROM: Full    Dental  (+) Dental Advisory Given   Pulmonary PE 05/08/2019 SARS coronavirus NEG   breath sounds clear to auscultation       Cardiovascular (-) angina+ DVT   Rhythm:Regular Rate:Normal  '11 ECHO: EF 65-70%, valves OK   Neuro/Psych negative neurological ROS     GI/Hepatic negative GI ROS, Neg liver ROS,   Endo/Other  obese  Renal/GU negative Renal ROS   Prostate cancer    Musculoskeletal   Abdominal (+) + obese,   Peds  Hematology Coumadin: on lovenox bridge   Anesthesia Other Findings   Reproductive/Obstetrics                            Anesthesia Physical Anesthesia Plan  ASA: III  Anesthesia Plan: General   Post-op Pain Management:    Induction: Intravenous  PONV Risk Score and Plan: 2 and Ondansetron and Dexamethasone  Airway Management Planned: LMA  Additional Equipment:   Intra-op Plan:   Post-operative Plan:   Informed Consent: I have reviewed the patients History and Physical, chart, labs and discussed the procedure including the risks, benefits and alternatives for the proposed anesthesia with the patient or authorized representative who has indicated his/her understanding and acceptance.     Dental advisory given  Plan Discussed with: CRNA and Surgeon  Anesthesia Plan Comments:         Anesthesia Quick Evaluation

## 2019-05-12 NOTE — Interval H&P Note (Signed)
History and Physical Interval Note:  05/12/2019 8:51 AM  Juan [name]  has presented today for surgery, with the diagnosis of PROSTATE CANCER.  The various methods of treatment have been discussed with the patient and family. After consideration of risks, benefits and other options for treatment, the patient has consented to  Procedure(s): RADIOACTIVE SEED IMPLANT/BRACHYTHERAPY IMPLANT (N/A) SPACE OAR INSTILLATION (N/A) as a surgical intervention.  The patient's history has been reviewed, patient examined, no change in status, stable for surgery.  I have reviewed the patient's chart and labs.  Questions were answered to the patient's satisfaction.     Irine Seal

## 2019-05-12 NOTE — Discharge Instructions (Addendum)
Brachytherapy for Prostate Cancer, Care After ° °This sheet gives you information about how to care for yourself after your procedure. Your health care provider may also give you more specific instructions. If you have problems or questions, contact your health care provider. °What can I expect after the procedure? °After the procedure, it is common to have: °· Trouble passing urine. °· Blood in the urine or semen. °· Constipation. °· Frequent feeling of an urgent need to urinate. °· Bruising, swelling, and tenderness of the area behind the scrotum (perineum). °· Bloating and gas. °· Fatigue. °· Burning or pain in the rectum. °· Problems getting or keeping an erection (erectile dysfunction). °· Nausea. °Follow these instructions at home: °Managing pain, stiffness, and swelling °· If directed, apply ice to the affected area: °? Put ice in a plastic bag. °? Place a towel between your skin and the bag. °? Leave the ice on for 20 minutes, 2-3 times a day. °· Try not to sit directly on the area behind the scrotum. A soft cushion can help with discomfort. °Activity °· Do not drive for 24 hours if you were given a medicine to help you relax (sedative). °· Do not drive or use heavy machinery while taking prescription pain medicine. °· Rest as told by your health care provider. °· Most people can return to normal activities a few days or weeks after the procedure. Ask your health care provider what activities are safe for you. °Eating and drinking °· Drink enough fluid to keep your urine clear or pale yellow. °· Eat a healthy, balanced diet. This includes lean proteins, whole grains, and plenty of fruits and vegetables. °General instructions °· Take over-the-counter and prescription medicines only as told by your health care provider. °· Keep all follow-up visits as told by your health care provider. This is important. You may still need additional treatment. °· Do not take baths, swim, or use a hot tub until your health  care provider approves. Shower and wash the area behind the scrotum gently. °· Do not have sex for one week after the treatment, or until your health care provider approves. °· If you have permanent, low-dose brachytherapy implants: °? Limit close contact with children and pregnant women for 2 months or as told by your health care provider. This is important because of the radiation that is still active in the prostate. °? You may set off radioactive sensors, such as airport screenings. Ask your health care provider for a document that explains your treatment. °? You may be instructed to use a condom during sex for the first 2 months after low-dose brachytherapy. °Contact a health care provider if: °· You have a fever or chills. °· You do not have a bowel movement for 3-4 days after the procedure. °· You have diarrhea for 3-4 days after the procedure. °· You develop any new symptoms, such as problems with urinating or erectile dysfunction. °· You have abdomen (abdominal) pain. °· You have more blood in your urine. °Get help right away if: °· You cannot urinate. °· There is excessive bleeding from your rectum. °· You have unusual drainage coming from your rectum. °· You have severe pain in the treated area that does not go away with pain medicine. °· You have severe nausea or vomiting. °Summary °· If you have permanent, low-dose brachytherapy implants, limit close contact with children and pregnant women for 2 months or as told by your health care provider. This is important because of the radiation   that is still active in the prostate.  Talk with your health care provider about your risk of brachytherapy side effects, such as erectile dysfunction or urinary problems. Your health care provider will be able to recommend possible treatment options.  Keep all follow-up visits as told by your health care provider. This is important. You may need additional treatment. This information is not intended to replace  advice given to you by your health care provider. Make sure you discuss any questions you have with your health care provider. Document Released: 11/17/2010 Document Revised: 09/27/2017 Document Reviewed: 11/16/2016 Elsevier Patient Education  [place] Instructions  Activity: Get plenty of rest for the remainder of the day. A responsible individual must stay with you for 24 hours following the procedure.  For the next 24 hours, DO NOT: -Drive a car -Paediatric nurse -Drink alcoholic beverages -Take any medication unless instructed by your physician -Make any legal decisions or sign important papers.  Meals: Start with liquid foods such as gelatin or soup. Progress to regular foods as tolerated. Avoid greasy, spicy, heavy foods. If nausea and/or vomiting occur, drink only clear liquids until the nausea and/or vomiting subsides. Call your physician if vomiting continues.  Special Instructions/Symptoms: Your throat may feel dry or sore from the anesthesia or the breathing tube placed in your throat during surgery. If this causes discomfort, gargle with warm salt water. The discomfort should disappear within 24 hours.  If you had a scopolamine patch placed behind your ear for the management of post- operative nausea and/or vomiting:  1. The medication in the patch is effective for 72 hours, after which it should be removed.  Wrap patch in a tissue and discard in the trash. Wash hands thoroughly with soap and water. 2. You may remove the patch earlier than 72 hours if you experience unpleasant side effects which may include dry mouth, dizziness or visual disturbances. 3. Avoid touching the patch. Wash your hands with soap and water after contact with the patch.

## 2019-05-12 NOTE — Progress Notes (Signed)
  Radiation Oncology         [phone]) [phone] ________________________________  Name: Juan [name] MRN: [id number]  Date: 05/12/2019  DOB: Mar 02, 1951       Prostate Seed Implant  IB:[id number], [place], PA-C  No ref. provider found  DIAGNOSIS: 68 y.o. gentleman with stage T1c adenocarcinoma of the prostate with a Gleason's score of 4+3 and a PSA of 12.0    ICD-10-CM   1. Prostate cancer ([place])  C61 DG Chest 2 View    DG Chest 2 View    PROCEDURE: Insertion of radioactive I-125 seeds into the prostate gland.  RADIATION DOSE: 145 Gy, definitive therapy.  TECHNIQUE: Juan [name] was brought to the operating room with the urologist. He was placed in the dorsolithotomy position. He was catheterized and a rectal tube was inserted. The perineum was shaved, prepped and draped. The ultrasound probe was then introduced into the rectum to see the prostate gland.  TREATMENT DEVICE: A needle grid was attached to the ultrasound probe stand and anchor needles were placed.  3D PLANNING: The prostate was imaged in 3D using a sagittal sweep of the prostate probe. These images were transferred to the planning computer. There, the prostate, urethra and rectum were defined on each axial reconstructed image. Then, the software created an optimized 3D plan and a few seed positions were adjusted. The quality of the plan was reviewed using [facility] information for the target and the following two organs at risk:  Urethra and Rectum.  Then the accepted plan was printed and handed off to the radiation therapist.  Under my supervision, the custom loading of the seeds and spacers was carried out and loaded into sealed vicryl sleeves.  These pre-loaded needles were then placed into the needle holder.Marland Kitchen  PROSTATE VOLUME STUDY:  Using transrectal ultrasound the volume of the prostate was verified to be 49.95 cc.  SPECIAL TREATMENT PROCEDURE/SUPERVISION AND HANDLING: The pre-loaded needles were then delivered under sagittal  guidance. A total of 22 needles were used to deposit 69 seeds in the prostate gland. The individual seed activity was 0.544 mCi.  SpaceOAR:  Yes  COMPLEX SIMULATION: At the end of the procedure, an anterior radiograph of the pelvis was obtained to document seed positioning and count. Cystoscopy was performed to check the urethra and bladder.  MICRODOSIMETRY: At the end of the procedure, the patient was emitting 0.097 mR/hr at 1 meter. Accordingly, he was considered safe for hospital discharge.  PLAN: The patient will return to the radiation oncology clinic for post implant CT dosimetry in three weeks.   ________________________________  Sheral Apley Tammi Klippel, M.D.

## 2019-05-12 NOTE — Anesthesia Postprocedure Evaluation (Signed)
Anesthesia Post Note  Patient: Juan [name]  Procedure(s) Performed: RADIOACTIVE SEED IMPLANT/BRACHYTHERAPY IMPLANT (N/A Prostate) SPACE OAR INSTILLATION (N/A Rectum) CYSTOSCOPY (N/A Bladder)     Patient location during evaluation: PACU Anesthesia Type: General Level of consciousness: sedated and patient cooperative Pain management: pain level controlled Vital Signs Assessment: post-procedure vital signs reviewed and stable Respiratory status: spontaneous breathing Cardiovascular status: stable Anesthetic complications: no    Last Vitals:  Vitals:   05/12/19 1220 05/12/19 1400  BP: 109/67 126/76  Pulse: 60 (!) 57  Resp: 14 16  Temp:  36.5 C  SpO2: 99% 100%    Last Pain:  Vitals:   05/12/19 1315  TempSrc:   PainSc: 2                  Nolon Nations

## 2019-05-12 NOTE — Op Note (Signed)
PATIENT:  Juan [name]  PRE-OPERATIVE DIAGNOSIS:  Adenocarcinoma of the prostate  POST-OPERATIVE DIAGNOSIS:  Same  PROCEDURE:  Procedure(s): 1. I-125 radioactive seed implantation 2. Cystoscopy  SURGEON:  Surgeon(s): Irine Seal MD  Radiation oncologist: Dr. Tyler Pita  ANESTHESIA:  General  EBL:  Minimal  DRAINS: 110 French Foley catheter  INDICATION: Juan [name] is a 68 y.o. with Stage T1c Nx Mx, Gleason 7(4+3) prostate cancer who has elected brachytherapy for treatment.  Description of procedure: After informed consent the patient was brought to the major OR, placed on the table and administered general anesthesia. He was then moved to the modified lithotomy position with his perineum perpendicular to the floor. His perineum and genitalia were then sterilely prepped. An official timeout was then performed. A 16 French Foley catheter was then placed in the bladder and filled with dilute contrast, a rectal tube was placed in the rectum and the transrectal ultrasound probe was placed in the rectum and affixed to the stand. He was then sterilely draped.  The sterile grid was installed.   Anchor needles were then placed.   Real time ultrasonography was used along with the seed planning software spot-pro version 3.1-00. This was used to develop the seed plan including the number of needles as well as number of seeds required for complete and adequate coverage. Real-time ultrasonography was then used along with the previously developed plan  to implant a total of 69 seeds using 22 needles for a target dose of 145 Gy. This proceeded without difficulty or complication.  The guide and stabilizers were removed and the 18g spinal needle was passed into the fat stripe posterior to the prostate in the midline with US guidance.  Saline was gently injected, confirming good needle placement.   The SpaceOAR polymer was then gently injected with good distribution noted on [place].   The Foley  catheter was then removed as well as the transrectal ultrasound probe and rectal probe. Flexible cystoscopy was then performed using the 17 French flexible scope which revealed a normal urethra throughout its length down to the sphincter which appeared intact. The prostatic urethra was 3-4cm with bilobar hyperplasia and some obstruction. The bladder was then entered and fully and systematically inspected.  The ureteral orifices were noted to be of normal configuration and position. The mucosa revealed no evidence of tumors.  There was mild trabeculation.  There were also no stones identified within the bladder.  No seeds or spacers were seen and/or removed from the bladder.  The cystoscope was then removed.  The drapes were removed.  The perineum was cleaned and dressed.  He was taken out of the lithotomy position and was awakened and taken to recovery room in stable and satisfactory condition. He tolerated procedure well and there were no intraoperative complications.

## 2019-05-12 NOTE — Anesthesia Procedure Notes (Signed)
Date/Time: 05/12/2019 11:22 AM Performed by: Cynda Familia, CRNA Oxygen Delivery Method: Simple face mask Placement Confirmation: positive ETCO2 and breath sounds checked- equal and bilateral Dental Injury: Teeth and Oropharynx as per pre-operative assessment

## 2019-05-12 NOTE — Transfer of Care (Signed)
Immediate Anesthesia Transfer of Care Note  Patient: Juan [name]  Procedure(s) Performed: RADIOACTIVE SEED IMPLANT/BRACHYTHERAPY IMPLANT (N/A Prostate) SPACE OAR INSTILLATION (N/A Rectum)  Patient Location: PACU  Anesthesia Type:General  Level of Consciousness: sedated  Airway & Oxygen Therapy: Patient Spontanous Breathing and Patient connected to face mask oxygen  Post-op Assessment: Report given to RN and Post -op Vital signs reviewed and stable  Post vital signs: Reviewed and stable  Last Vitals:  Vitals Value Taken Time  BP    Temp    Pulse    Resp    SpO2      Last Pain:  Vitals:   05/12/19 0814  TempSrc:   PainSc: 0-No pain      Patients Stated Pain Goal: 4 (05/69/79 4801)  Complications: No apparent anesthesia complications

## 2019-05-13 ENCOUNTER — Encounter (HOSPITAL_BASED_OUTPATIENT_CLINIC_OR_DEPARTMENT_OTHER): Payer: Self-pay | Admitting: Urology

## 2019-05-15 ENCOUNTER — Telehealth: Payer: Self-pay | Admitting: *Deleted

## 2019-05-15 NOTE — Telephone Encounter (Signed)
CALLED PATIENT TO INFORM OF HIS MRI ON 05-28-19, LVM FOR A RETURN CALL

## 2019-05-22 DIAGNOSIS — I2699 Other pulmonary embolism without acute cor pulmonale: Secondary | ICD-10-CM | POA: Diagnosis not present

## 2019-05-22 DIAGNOSIS — Z7901 Long term (current) use of anticoagulants: Secondary | ICD-10-CM | POA: Diagnosis not present

## 2019-05-25 DIAGNOSIS — Z7901 Long term (current) use of anticoagulants: Secondary | ICD-10-CM | POA: Diagnosis not present

## 2019-05-25 DIAGNOSIS — I2699 Other pulmonary embolism without acute cor pulmonale: Secondary | ICD-10-CM | POA: Diagnosis not present

## 2019-05-27 ENCOUNTER — Telehealth: Payer: Self-pay | Admitting: *Deleted

## 2019-05-27 NOTE — Telephone Encounter (Signed)
Called patient to remind of post seed appts. and MRI for 05-28-19, spoke with patient and he is aware of these appts.

## 2019-05-28 ENCOUNTER — Other Ambulatory Visit: Payer: Self-pay

## 2019-05-28 ENCOUNTER — Ambulatory Visit
Admission: RE | Admit: 2019-05-28 | Discharge: 2019-05-28 | Disposition: A | Discharge status: Home / Self Care | Payer: PPO | Source: Ambulatory Visit | Attending: Urology | Admitting: Urology

## 2019-05-28 ENCOUNTER — Encounter: Payer: Self-pay | Admitting: Urology

## 2019-05-28 ENCOUNTER — Ambulatory Visit (HOSPITAL_COMMUNITY)
Admission: RE | Admit: 2019-05-28 | Discharge: 2019-05-28 | Disposition: A | Discharge status: Home / Self Care | Payer: PPO | Source: Ambulatory Visit | Attending: Urology | Admitting: Urology

## 2019-05-28 VITALS — BP 109/79 | HR 63 | Temp 98.5°F | Resp 18 | Wt 230.0 lb

## 2019-05-28 DIAGNOSIS — R3912 Poor urinary stream: Secondary | ICD-10-CM | POA: Insufficient documentation

## 2019-05-28 DIAGNOSIS — Z7901 Long term (current) use of anticoagulants: Secondary | ICD-10-CM | POA: Diagnosis not present

## 2019-05-28 DIAGNOSIS — R3911 Hesitancy of micturition: Secondary | ICD-10-CM | POA: Diagnosis not present

## 2019-05-28 DIAGNOSIS — R351 Nocturia: Secondary | ICD-10-CM | POA: Diagnosis not present

## 2019-05-28 DIAGNOSIS — C61 Malignant neoplasm of prostate: Secondary | ICD-10-CM | POA: Insufficient documentation

## 2019-05-28 DIAGNOSIS — R350 Frequency of micturition: Secondary | ICD-10-CM | POA: Diagnosis not present

## 2019-05-28 NOTE — Progress Notes (Signed)
Radiation Oncology         [phone]) [phone] ________________________________  Name: BRAVEN WOLK MRN: [id number]  Date: 05/28/2019  DOB: 16-Dec-1950  Post-Seed Follow-Up Visit Note  CC: Waldon Reining, MD  Diagnosis:   68 y.o. gentleman with stage T1c adenocarcinoma of the prostate with a Gleason's score of 4+3 and a PSA of 12.0    ICD-10-CM   1. Malignant neoplasm of prostate ([place])  C61     Interval Since Last Radiation:  2 weeks 05/12/19:  Insertion of radioactive I-125 seeds into the prostate gland; 145 Gy, definitive therapy with placement of SpaceOAR gel.  Narrative:  The patient returns today for routine follow-up.  He is complaining of increased urinary frequency and urinary hesitation symptoms. He filled out a questionnaire regarding urinary function today providing and overall IPSS score of 24 characterizing his symptoms as severe with weak stream, hesitancy, feelings of incomplete emptying, frequency and urgency. He specifically denies dysuria, gross hematuria or incontinence.  Prior to his implant, he reported nocturia 1-2/night and increased frequency and urgency. He currently denies any abdominal pain or bowel symptoms.  He reports a healthy appetite and is maintaining his weight.  Overall, he is pleased with his progress to date.  ALLERGIES:  has No Known Allergies.  Meds: Current Outpatient Medications  Medication Sig Dispense Refill  . enoxaparin (LOVENOX) 80 MG/0.8ML injection Inject 1.6 mLs (160 mg total) into the skin daily. 7 Syringe 1  . traMADol (ULTRAM) 50 MG tablet Take 1 tablet (50 mg total) by mouth every 6 (six) hours as needed. 8 tablet 0  . warfarin (COUMADIN) 5 MG tablet Take 1 tablet (5 mg total) by mouth daily. 30 tablet 1   No current facility-administered medications for this encounter.     Physical Findings: In general this is a well appearing Caucasian male in no acute distress. He's alert and oriented x4 and appropriate  throughout the examination. Cardiopulmonary assessment is negative for acute distress and he exhibits normal effort.   Lab Findings: Lab Results  Component Value Date   WBC 6.4 05/08/2019   HGB 13.1 05/08/2019   HCT 40.2 05/08/2019   MCV 92.4 05/08/2019   PLT 189 05/08/2019    Radiographic Findings:  Patient underwent CT imaging in our clinic for post implant dosimetry. The CT will be reviewed by Dr. Tammi Klippel to confirm there is an adequate distribution of radioactive seeds throughout the prostate gland and ensure that there are no seeds in or near the rectum. His scheduled for prostate MRI later this morning at 10am and those images will be fused with his CT images for further evaluation. We suspect the final radiation plan and dosimetry will show appropriate coverage of the prostate gland. He understands that we will call and inform him of any unexpected findings on further review of his imaging and dosimetry.  Impression/Plan: 68 y.o. gentleman with stage T1c adenocarcinoma of the prostate with a Gleason's score of 4+3 and a PSA of 12.0. The patient is recovering from the effects of radiation. His urinary symptoms should gradually improve over the next 4-6 months. We talked about this today. He is encouraged by his improvement already and is otherwise pleased with his outcome. We also talked about long-term follow-up for prostate cancer following seed implant. He understands that ongoing PSA determinations and digital rectal exams will help perform surveillance to rule out disease recurrence. He has a follow up appointment scheduled with Dr. Jeffie Pollock on Tuesday, 06/02/19. He  understands what to expect with his PSA measures. Patient was also educated today about some of the long-term effects from radiation including a small risk for rectal bleeding and possibly erectile dysfunction. We talked about some of the general management approaches to these potential complications. However, I did encourage the  patient to contact our office or return at any point if he has questions or concerns related to his previous radiation and prostate cancer.    Nicholos Johns, PA-C

## 2019-05-28 NOTE — Progress Notes (Signed)
  Radiation Oncology         [phone]) [phone] ________________________________  Name: Juan [name] MRN: [id number]  Date: 05/28/2019  DOB: 07/20/1951  COMPLEX SIMULATION NOTE  NARRATIVE:  The patient was brought to the [facility] suite today following prostate seed implantation approximately one month ago.  Identity was confirmed.  All relevant records and images related to the planned course of therapy were reviewed.  Then, the patient was set-up supine.  CT images were obtained.  The CT images were loaded into the planning software.  Then the prostate and rectum were contoured.  Treatment planning then occurred.  The implanted iodine 125 seeds were identified by the physics staff for projection of radiation distribution  I have requested : 3D Simulation  I have requested a DVH of the following structures: Prostate and rectum.    ________________________________  Sheral Apley Tammi Klippel, M.D.

## 2019-05-28 NOTE — Addendum Note (Signed)
Encounter addended by: Sherrlyn Hock, LPN on: 8/34/7583 0:74 AM  Actions taken: Charge Capture section accepted

## 2019-05-28 NOTE — Progress Notes (Signed)
Mr. Juan [name] is here today for a follow-up  Post seed appointment today. Patient has a  MRI today @ 10:00am.Patient states that he see his urologist on Tuesday. Patient states that he has some dysuria. Denies any hematuria . Patient denies any leakage . States that he has some urgency. Patient reports nocturia 1-6. Patient states that he empties his bladder with urination. Vitals:   05/28/19 0903  BP: 109/79  Pulse: 63  Resp: 18  Temp: 98.5 F (36.9 C)  TempSrc: Oral  SpO2: 96%  Weight: 230 lb (104.3 kg)

## 2019-05-29 DIAGNOSIS — Z7901 Long term (current) use of anticoagulants: Secondary | ICD-10-CM | POA: Diagnosis not present

## 2019-05-29 DIAGNOSIS — I2699 Other pulmonary embolism without acute cor pulmonale: Secondary | ICD-10-CM | POA: Diagnosis not present

## 2019-06-02 DIAGNOSIS — C61 Malignant neoplasm of prostate: Secondary | ICD-10-CM | POA: Diagnosis not present

## 2019-06-02 DIAGNOSIS — R8279 Other abnormal findings on microbiological examination of urine: Secondary | ICD-10-CM | POA: Diagnosis not present

## 2019-06-10 ENCOUNTER — Ambulatory Visit
Admission: RE | Admit: 2019-06-10 | Discharge: 2019-06-10 | Disposition: A | Discharge status: Home / Self Care | Payer: PPO | Source: Ambulatory Visit | Attending: Radiation Oncology | Admitting: Radiation Oncology

## 2019-06-10 ENCOUNTER — Encounter: Payer: Self-pay | Admitting: Radiation Oncology

## 2019-06-10 DIAGNOSIS — Z510 Encounter for antineoplastic radiation therapy: Secondary | ICD-10-CM | POA: Diagnosis not present

## 2019-06-10 DIAGNOSIS — C61 Malignant neoplasm of prostate: Secondary | ICD-10-CM | POA: Diagnosis not present

## 2019-06-12 DIAGNOSIS — Z7901 Long term (current) use of anticoagulants: Secondary | ICD-10-CM | POA: Diagnosis not present

## 2019-06-12 DIAGNOSIS — I2699 Other pulmonary embolism without acute cor pulmonale: Secondary | ICD-10-CM | POA: Diagnosis not present

## 2019-06-17 DIAGNOSIS — H9202 Otalgia, left ear: Secondary | ICD-10-CM | POA: Diagnosis not present

## 2019-06-21 NOTE — Progress Notes (Signed)
  Radiation Oncology         [phone]) [phone] ________________________________  Name: Juan [name] MRN: [id number]  Date: 06/10/2019  DOB: 1951-07-07  3D Planning Note   Prostate Brachytherapy Post-Implant Dosimetry  Diagnosis: 68 y.o. gentleman with stage T1c adenocarcinoma of the prostate with a Gleason's score of 4+3 and a PSA of 12.0  Narrative: On a previous date, [name] returned following prostate seed implantation for post implant planning. He underwent CT scan complex simulation to delineate the three-dimensional structures of the pelvis and demonstrate the radiation distribution.  Since that time, the seed localization, and complex isodose planning with dose volume histograms have now been completed.  Results:   Prostate Coverage - The dose of radiation delivered to the 90% or more of the prostate gland (D90) was 100.4% of the prescription dose. This exceeds our goal of greater than 90%. Rectal Sparing - The volume of rectal tissue receiving the prescription dose or higher was 0.0 cc. This falls under our thresholds tolerance of 1.0 cc.  Impression: The prostate seed implant appears to show adequate target coverage and appropriate rectal sparing.  Plan:  The patient will continue to follow with urology for ongoing PSA determinations. I would anticipate a high likelihood for local tumor control with minimal risk for rectal morbidity.  ________________________________  Sheral Apley Tammi Klippel, M.D.

## 2019-07-10 DIAGNOSIS — I2699 Other pulmonary embolism without acute cor pulmonale: Secondary | ICD-10-CM | POA: Diagnosis not present

## 2019-07-10 DIAGNOSIS — Z7901 Long term (current) use of anticoagulants: Secondary | ICD-10-CM | POA: Diagnosis not present

## 2019-08-07 DIAGNOSIS — Z7901 Long term (current) use of anticoagulants: Secondary | ICD-10-CM | POA: Diagnosis not present

## 2019-08-07 DIAGNOSIS — Z86711 Personal history of pulmonary embolism: Secondary | ICD-10-CM | POA: Diagnosis not present

## 2019-08-26 DIAGNOSIS — R972 Elevated prostate specific antigen [PSA]: Secondary | ICD-10-CM | POA: Diagnosis not present

## 2019-09-02 ENCOUNTER — Telehealth: Payer: Self-pay | Admitting: Radiation Oncology

## 2019-09-02 DIAGNOSIS — N401 Enlarged prostate with lower urinary tract symptoms: Secondary | ICD-10-CM | POA: Diagnosis not present

## 2019-09-02 DIAGNOSIS — R972 Elevated prostate specific antigen [PSA]: Secondary | ICD-10-CM | POA: Diagnosis not present

## 2019-09-02 DIAGNOSIS — R3915 Urgency of urination: Secondary | ICD-10-CM | POA: Diagnosis not present

## 2019-09-02 DIAGNOSIS — C61 Malignant neoplasm of prostate: Secondary | ICD-10-CM | POA: Diagnosis not present

## 2019-09-02 NOTE — Telephone Encounter (Signed)
Received voicemail message from patient requesting return call. Phoned patient back as requested. Patient explained he has billing questions. Advised patient to phone [phone]. Patient states he has spoken with them but they couldn't help him. Patient explains he has a $64 bill dated 06/10/2019. Patient states, I don't have any record of being seen on 06/10/2019. Explained Dr. Tammi Klippel entered a note reference his treatment on 06/10/19. Patient questioned if physician are billing now to write notes. Explained physicians are not billing for notes and that the brachytherapy he received is all under one umbrella. 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 Reinforced that a seed implant and the term brachytherapy are interchangeable. Encouraged patient to reach back out to the billing department at [phone] for further explanation. Patient verbalized understanding.

## 2019-10-08 DIAGNOSIS — I2699 Other pulmonary embolism without acute cor pulmonale: Secondary | ICD-10-CM | POA: Diagnosis not present

## 2019-10-08 DIAGNOSIS — Z7901 Long term (current) use of anticoagulants: Secondary | ICD-10-CM | POA: Diagnosis not present

## 2019-11-13 DIAGNOSIS — I2699 Other pulmonary embolism without acute cor pulmonale: Secondary | ICD-10-CM | POA: Diagnosis not present

## 2019-11-13 DIAGNOSIS — Z7901 Long term (current) use of anticoagulants: Secondary | ICD-10-CM | POA: Diagnosis not present

## 2019-12-11 DIAGNOSIS — Z86711 Personal history of pulmonary embolism: Secondary | ICD-10-CM | POA: Diagnosis not present

## 2019-12-11 DIAGNOSIS — Z7901 Long term (current) use of anticoagulants: Secondary | ICD-10-CM | POA: Diagnosis not present

## 2020-01-08 DIAGNOSIS — Z7901 Long term (current) use of anticoagulants: Secondary | ICD-10-CM | POA: Diagnosis not present

## 2020-01-08 DIAGNOSIS — Z86711 Personal history of pulmonary embolism: Secondary | ICD-10-CM | POA: Diagnosis not present

## 2020-02-12 DIAGNOSIS — I2699 Other pulmonary embolism without acute cor pulmonale: Secondary | ICD-10-CM | POA: Diagnosis not present

## 2020-02-12 DIAGNOSIS — Z7901 Long term (current) use of anticoagulants: Secondary | ICD-10-CM | POA: Diagnosis not present

## 2020-03-02 DIAGNOSIS — C61 Malignant neoplasm of prostate: Secondary | ICD-10-CM | POA: Diagnosis not present

## 2020-03-09 DIAGNOSIS — N401 Enlarged prostate with lower urinary tract symptoms: Secondary | ICD-10-CM | POA: Diagnosis not present

## 2020-03-09 DIAGNOSIS — R3915 Urgency of urination: Secondary | ICD-10-CM | POA: Diagnosis not present

## 2020-03-09 DIAGNOSIS — Z8546 Personal history of malignant neoplasm of prostate: Secondary | ICD-10-CM | POA: Diagnosis not present

## 2020-03-11 DIAGNOSIS — I2699 Other pulmonary embolism without acute cor pulmonale: Secondary | ICD-10-CM | POA: Diagnosis not present

## 2020-03-11 DIAGNOSIS — Z7901 Long term (current) use of anticoagulants: Secondary | ICD-10-CM | POA: Diagnosis not present

## 2020-04-15 DIAGNOSIS — I2699 Other pulmonary embolism without acute cor pulmonale: Secondary | ICD-10-CM | POA: Diagnosis not present

## 2020-04-15 DIAGNOSIS — Z7901 Long term (current) use of anticoagulants: Secondary | ICD-10-CM | POA: Diagnosis not present

## 2020-05-13 DIAGNOSIS — I2699 Other pulmonary embolism without acute cor pulmonale: Secondary | ICD-10-CM | POA: Diagnosis not present

## 2020-05-13 DIAGNOSIS — Z7901 Long term (current) use of anticoagulants: Secondary | ICD-10-CM | POA: Diagnosis not present

## 2020-05-25 IMAGING — DX CHEST - 2 VIEW
2 series · 2 of 2 positions shown · non-contrast
Comparison: CT 09/30/2015

CLINICAL DATA: Prior prostate seed implant.

EXAM:
CHEST - 2 VIEW

[chest pa]
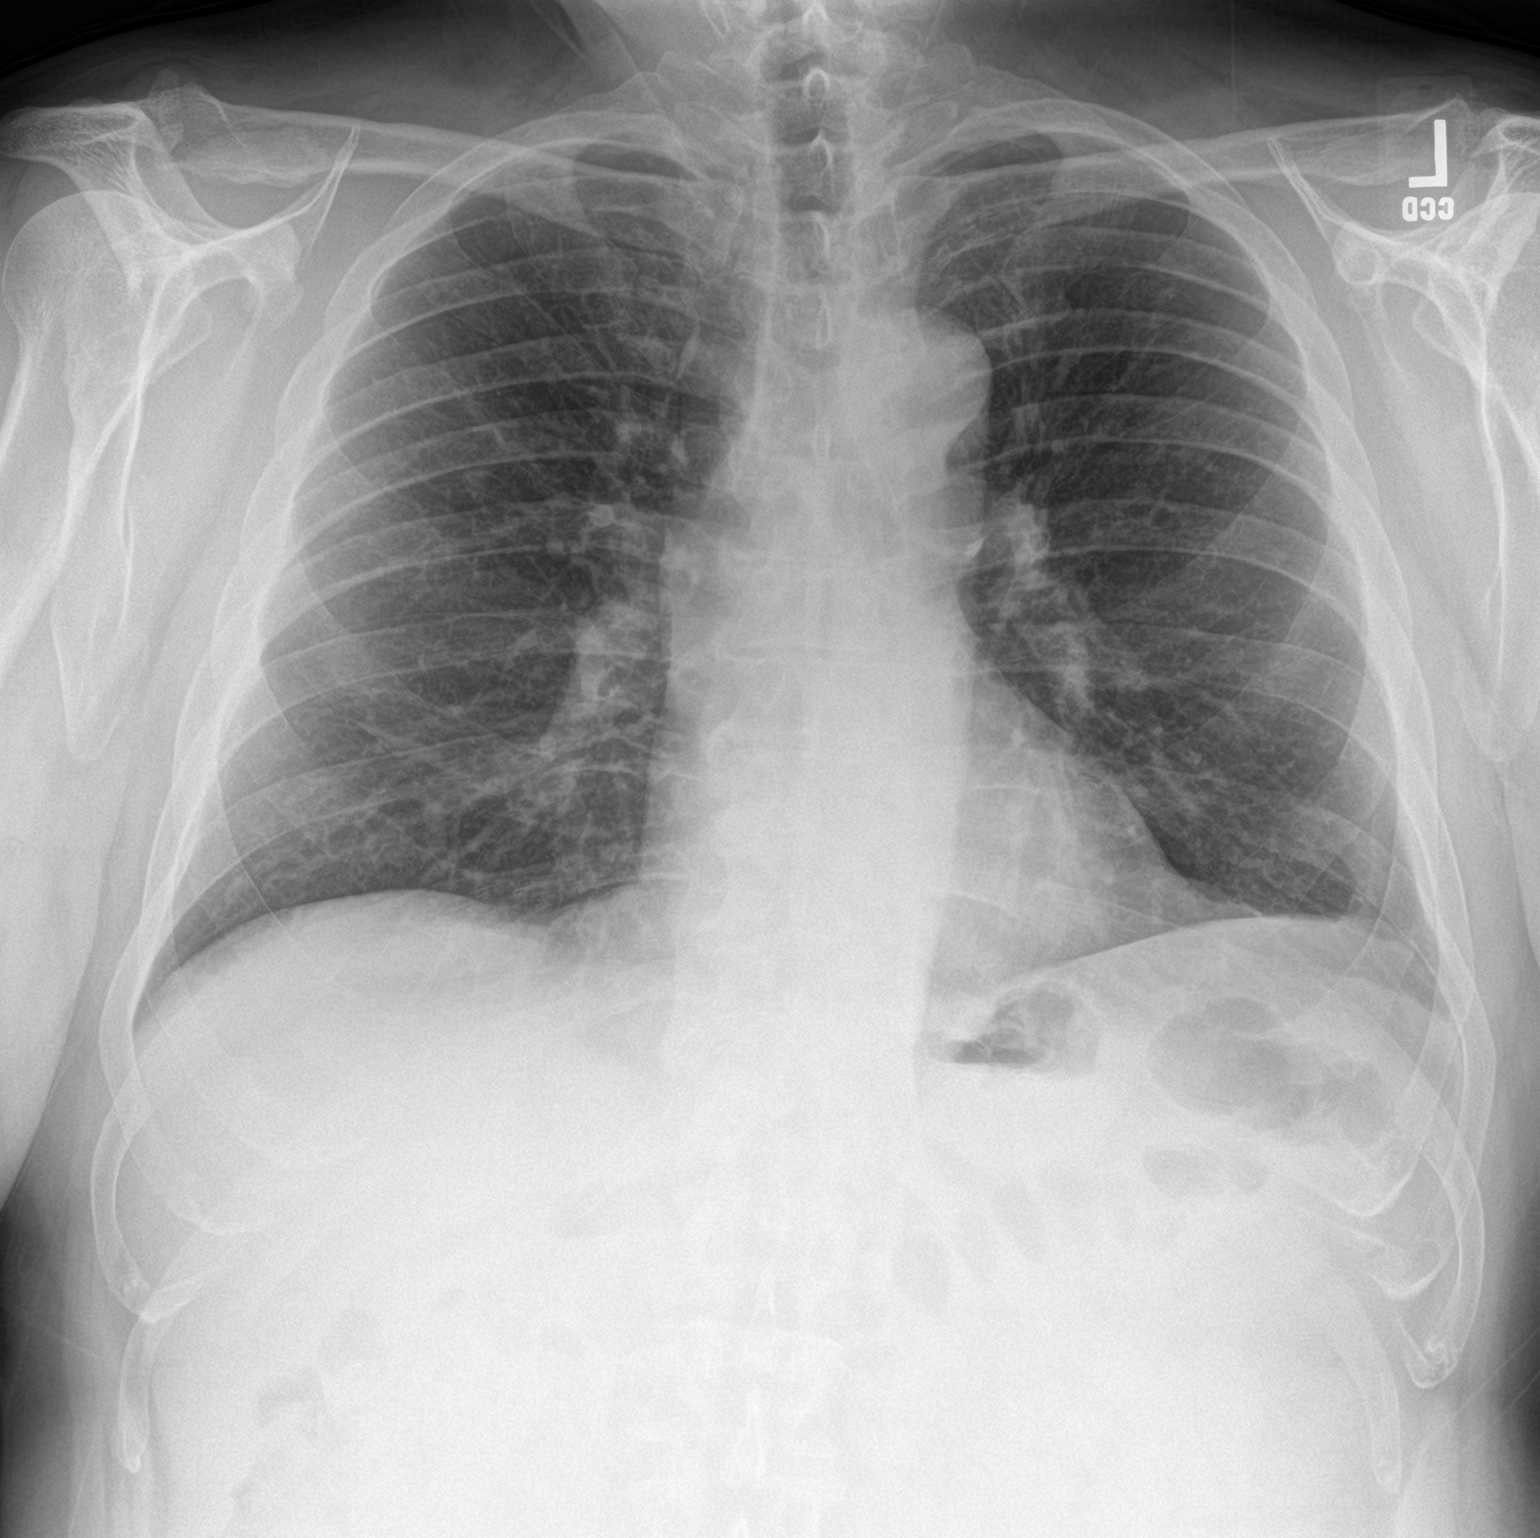

[chest lat]
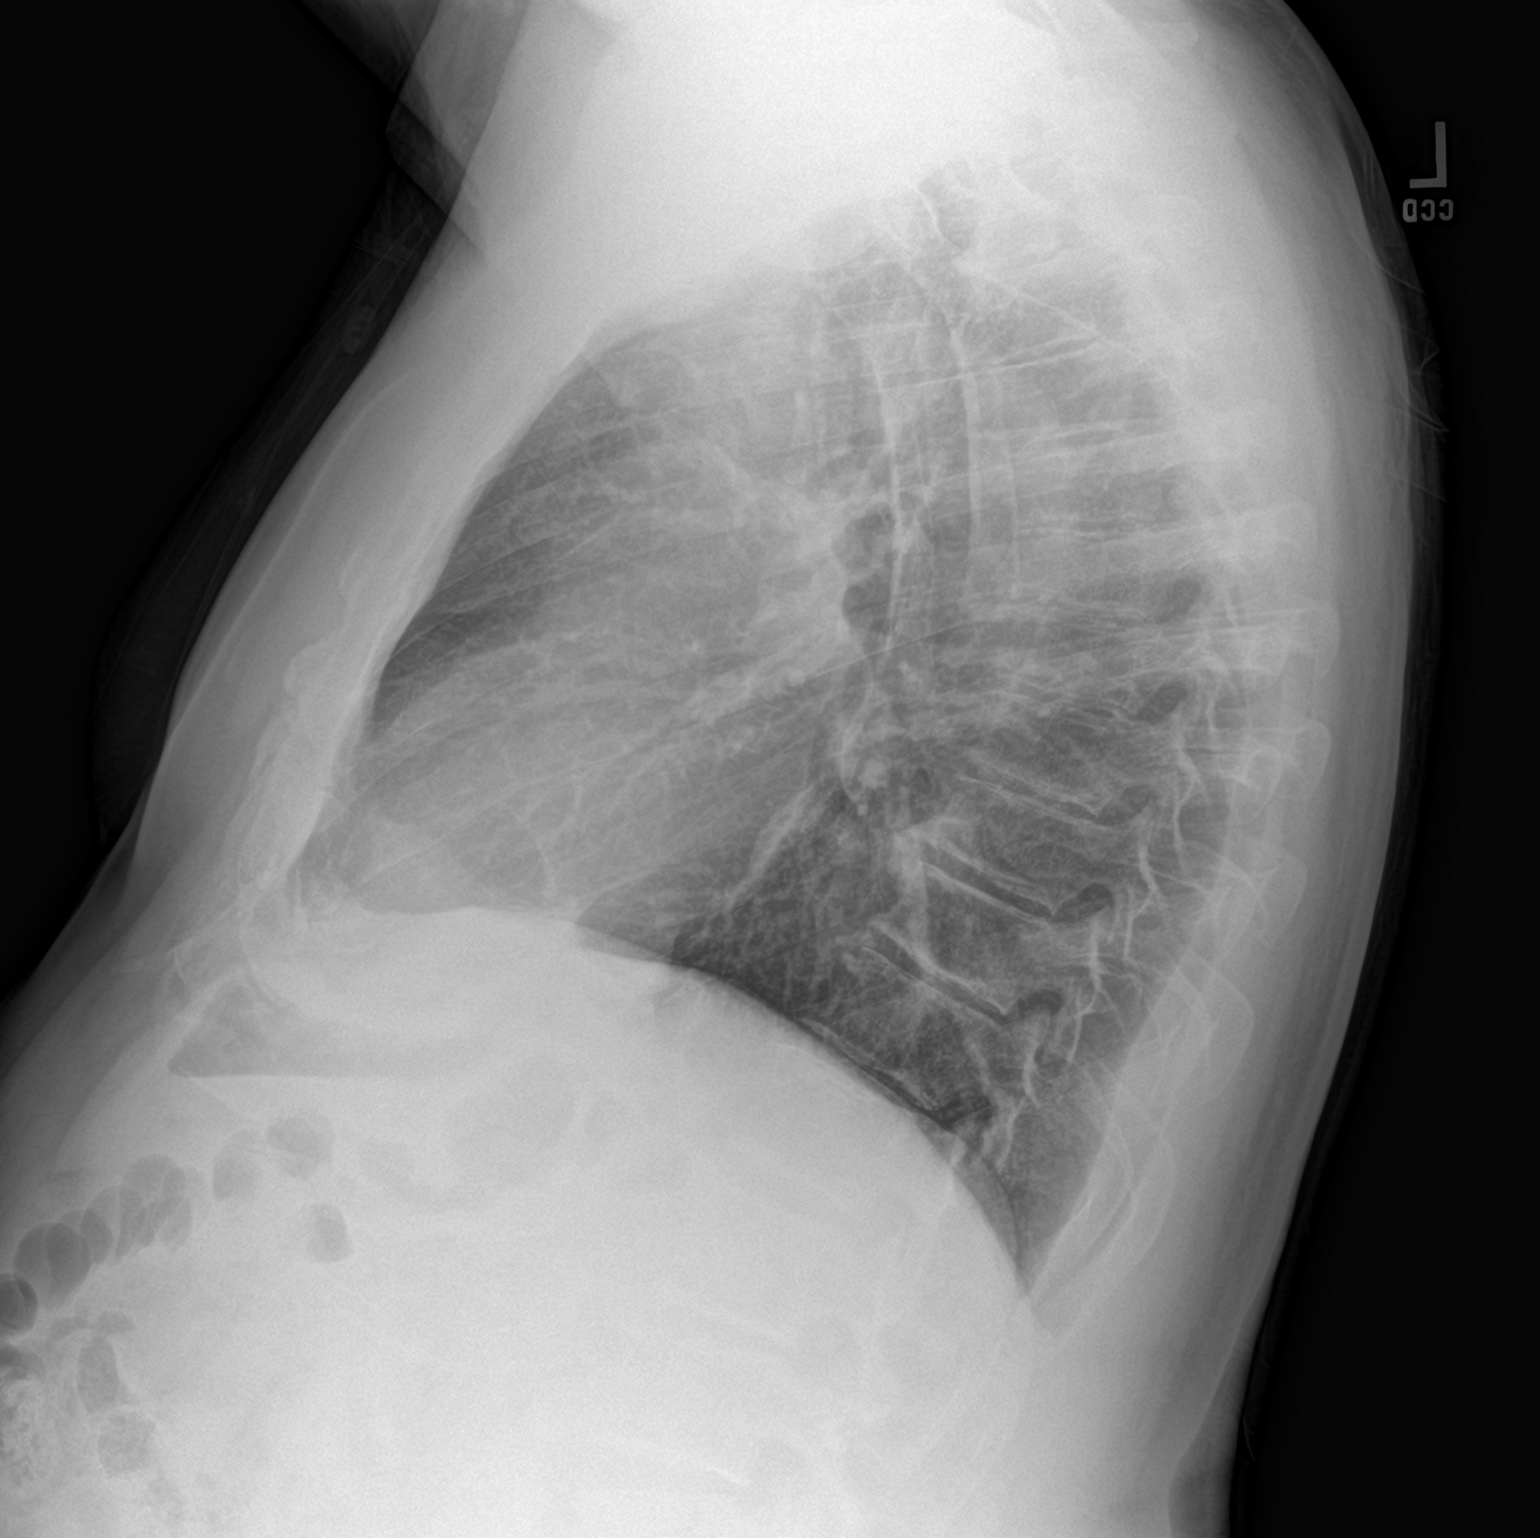

[2 of 2 positions shown; findings below may reference images not displayed]

FINDINGS: Normal cardiac silhouette. No effusion, infiltrate pneumothorax. No
acute osseous abnormality.
IMPRESSION: No acute cardiopulmonary process.

## 2020-06-10 DIAGNOSIS — Z85820 Personal history of malignant melanoma of skin: Secondary | ICD-10-CM | POA: Diagnosis not present

## 2020-06-10 DIAGNOSIS — E785 Hyperlipidemia, unspecified: Secondary | ICD-10-CM | POA: Diagnosis not present

## 2020-06-10 DIAGNOSIS — Z7901 Long term (current) use of anticoagulants: Secondary | ICD-10-CM | POA: Diagnosis not present

## 2020-06-10 DIAGNOSIS — Z0000 Encounter for general adult medical examination without abnormal findings: Secondary | ICD-10-CM | POA: Diagnosis not present

## 2020-06-10 DIAGNOSIS — Z8546 Personal history of malignant neoplasm of prostate: Secondary | ICD-10-CM | POA: Diagnosis not present

## 2020-06-10 DIAGNOSIS — R208 Other disturbances of skin sensation: Secondary | ICD-10-CM | POA: Diagnosis not present

## 2020-07-08 DIAGNOSIS — Z7901 Long term (current) use of anticoagulants: Secondary | ICD-10-CM | POA: Diagnosis not present

## 2020-07-08 DIAGNOSIS — Z86711 Personal history of pulmonary embolism: Secondary | ICD-10-CM | POA: Diagnosis not present

## 2020-08-26 DIAGNOSIS — Z86711 Personal history of pulmonary embolism: Secondary | ICD-10-CM | POA: Diagnosis not present

## 2020-08-26 DIAGNOSIS — Z7901 Long term (current) use of anticoagulants: Secondary | ICD-10-CM | POA: Diagnosis not present

## 2020-08-31 DIAGNOSIS — Z8546 Personal history of malignant neoplasm of prostate: Secondary | ICD-10-CM | POA: Diagnosis not present

## 2020-09-07 DIAGNOSIS — Z8546 Personal history of malignant neoplasm of prostate: Secondary | ICD-10-CM | POA: Diagnosis not present

## 2020-09-07 DIAGNOSIS — R9721 Rising PSA following treatment for malignant neoplasm of prostate: Secondary | ICD-10-CM | POA: Diagnosis not present

## 2020-09-07 DIAGNOSIS — N401 Enlarged prostate with lower urinary tract symptoms: Secondary | ICD-10-CM | POA: Diagnosis not present

## 2020-09-07 DIAGNOSIS — R3915 Urgency of urination: Secondary | ICD-10-CM | POA: Diagnosis not present

## 2020-09-30 DIAGNOSIS — Z7901 Long term (current) use of anticoagulants: Secondary | ICD-10-CM | POA: Diagnosis not present

## 2020-09-30 DIAGNOSIS — Z86711 Personal history of pulmonary embolism: Secondary | ICD-10-CM | POA: Diagnosis not present

## 2020-11-03 DIAGNOSIS — Z7901 Long term (current) use of anticoagulants: Secondary | ICD-10-CM | POA: Diagnosis not present

## 2020-11-03 DIAGNOSIS — Z86711 Personal history of pulmonary embolism: Secondary | ICD-10-CM | POA: Diagnosis not present

## 2020-11-29 DIAGNOSIS — Z7901 Long term (current) use of anticoagulants: Secondary | ICD-10-CM | POA: Diagnosis not present

## 2020-11-29 DIAGNOSIS — I2699 Other pulmonary embolism without acute cor pulmonale: Secondary | ICD-10-CM | POA: Diagnosis not present

## 2020-12-07 DIAGNOSIS — Z86711 Personal history of pulmonary embolism: Secondary | ICD-10-CM | POA: Diagnosis not present

## 2020-12-07 DIAGNOSIS — Z7901 Long term (current) use of anticoagulants: Secondary | ICD-10-CM | POA: Diagnosis not present

## 2020-12-08 DIAGNOSIS — Z8546 Personal history of malignant neoplasm of prostate: Secondary | ICD-10-CM | POA: Diagnosis not present

## 2021-01-04 DIAGNOSIS — Z7901 Long term (current) use of anticoagulants: Secondary | ICD-10-CM | POA: Diagnosis not present

## 2021-01-04 DIAGNOSIS — Z86711 Personal history of pulmonary embolism: Secondary | ICD-10-CM | POA: Diagnosis not present

## 2021-02-01 DIAGNOSIS — Z86711 Personal history of pulmonary embolism: Secondary | ICD-10-CM | POA: Diagnosis not present

## 2021-02-01 DIAGNOSIS — Z7901 Long term (current) use of anticoagulants: Secondary | ICD-10-CM | POA: Diagnosis not present

## 2021-02-27 DIAGNOSIS — Z8546 Personal history of malignant neoplasm of prostate: Secondary | ICD-10-CM | POA: Diagnosis not present

## 2021-03-01 DIAGNOSIS — Z7901 Long term (current) use of anticoagulants: Secondary | ICD-10-CM | POA: Diagnosis not present

## 2021-03-01 DIAGNOSIS — Z86711 Personal history of pulmonary embolism: Secondary | ICD-10-CM | POA: Diagnosis not present

## 2021-03-02 DIAGNOSIS — Z85820 Personal history of malignant melanoma of skin: Secondary | ICD-10-CM | POA: Diagnosis not present

## 2021-03-02 DIAGNOSIS — L720 Epidermal cyst: Secondary | ICD-10-CM | POA: Diagnosis not present

## 2021-03-02 DIAGNOSIS — L718 Other rosacea: Secondary | ICD-10-CM | POA: Diagnosis not present

## 2021-03-02 DIAGNOSIS — L738 Other specified follicular disorders: Secondary | ICD-10-CM | POA: Diagnosis not present

## 2021-03-06 DIAGNOSIS — N5201 Erectile dysfunction due to arterial insufficiency: Secondary | ICD-10-CM | POA: Diagnosis not present

## 2021-03-06 DIAGNOSIS — N401 Enlarged prostate with lower urinary tract symptoms: Secondary | ICD-10-CM | POA: Diagnosis not present

## 2021-03-06 DIAGNOSIS — R3915 Urgency of urination: Secondary | ICD-10-CM | POA: Diagnosis not present

## 2021-03-06 DIAGNOSIS — Z8546 Personal history of malignant neoplasm of prostate: Secondary | ICD-10-CM | POA: Diagnosis not present

## 2021-03-28 DIAGNOSIS — Z86711 Personal history of pulmonary embolism: Secondary | ICD-10-CM | POA: Diagnosis not present

## 2021-03-28 DIAGNOSIS — Z7901 Long term (current) use of anticoagulants: Secondary | ICD-10-CM | POA: Diagnosis not present

## 2021-04-04 DIAGNOSIS — Z86711 Personal history of pulmonary embolism: Secondary | ICD-10-CM | POA: Diagnosis not present

## 2021-04-04 DIAGNOSIS — Z7901 Long term (current) use of anticoagulants: Secondary | ICD-10-CM | POA: Diagnosis not present

## 2021-05-02 DIAGNOSIS — Z7901 Long term (current) use of anticoagulants: Secondary | ICD-10-CM | POA: Diagnosis not present

## 2021-05-02 DIAGNOSIS — Z86711 Personal history of pulmonary embolism: Secondary | ICD-10-CM | POA: Diagnosis not present

## 2021-05-09 DIAGNOSIS — Z7901 Long term (current) use of anticoagulants: Secondary | ICD-10-CM | POA: Diagnosis not present

## 2021-05-09 DIAGNOSIS — Z86711 Personal history of pulmonary embolism: Secondary | ICD-10-CM | POA: Diagnosis not present

## 2021-05-16 DIAGNOSIS — Z86711 Personal history of pulmonary embolism: Secondary | ICD-10-CM | POA: Diagnosis not present

## 2021-05-16 DIAGNOSIS — Z7901 Long term (current) use of anticoagulants: Secondary | ICD-10-CM | POA: Diagnosis not present

## 2021-05-30 DIAGNOSIS — Z7901 Long term (current) use of anticoagulants: Secondary | ICD-10-CM | POA: Diagnosis not present

## 2021-05-30 DIAGNOSIS — R9721 Rising PSA following treatment for malignant neoplasm of prostate: Secondary | ICD-10-CM | POA: Diagnosis not present

## 2021-06-06 DIAGNOSIS — Z7901 Long term (current) use of anticoagulants: Secondary | ICD-10-CM | POA: Diagnosis not present

## 2021-07-06 DIAGNOSIS — Z7901 Long term (current) use of anticoagulants: Secondary | ICD-10-CM | POA: Diagnosis not present

## 2021-08-04 DIAGNOSIS — Z7901 Long term (current) use of anticoagulants: Secondary | ICD-10-CM | POA: Diagnosis not present

## 2021-09-04 DIAGNOSIS — R9721 Rising PSA following treatment for malignant neoplasm of prostate: Secondary | ICD-10-CM | POA: Diagnosis not present

## 2021-09-11 DIAGNOSIS — Z8546 Personal history of malignant neoplasm of prostate: Secondary | ICD-10-CM | POA: Diagnosis not present

## 2021-09-11 DIAGNOSIS — N401 Enlarged prostate with lower urinary tract symptoms: Secondary | ICD-10-CM | POA: Diagnosis not present

## 2021-09-11 DIAGNOSIS — N5201 Erectile dysfunction due to arterial insufficiency: Secondary | ICD-10-CM | POA: Diagnosis not present

## 2021-09-11 DIAGNOSIS — R3915 Urgency of urination: Secondary | ICD-10-CM | POA: Diagnosis not present

## 2021-09-15 DIAGNOSIS — Z7901 Long term (current) use of anticoagulants: Secondary | ICD-10-CM | POA: Diagnosis not present

## 2021-10-17 DIAGNOSIS — Z7901 Long term (current) use of anticoagulants: Secondary | ICD-10-CM | POA: Diagnosis not present

## 2022-03-16 ENCOUNTER — Encounter: Payer: Self-pay | Admitting: Physician Assistant

## 2022-04-09 ENCOUNTER — Telehealth: Payer: Self-pay | Admitting: *Deleted

## 2022-04-09 ENCOUNTER — Encounter: Payer: Self-pay | Admitting: Physician Assistant

## 2022-04-09 ENCOUNTER — Ambulatory Visit: Payer: PPO | Admitting: Physician Assistant

## 2022-04-09 ENCOUNTER — Other Ambulatory Visit (INDEPENDENT_AMBULATORY_CARE_PROVIDER_SITE_OTHER): Payer: PPO

## 2022-04-09 VITALS — BP 122/78 | HR 68 | Ht 69.5 in | Wt 244.0 lb

## 2022-04-09 DIAGNOSIS — Z860100 Personal history of colon polyps, unspecified: Secondary | ICD-10-CM

## 2022-04-09 DIAGNOSIS — Z7901 Long term (current) use of anticoagulants: Secondary | ICD-10-CM

## 2022-04-09 DIAGNOSIS — K649 Unspecified hemorrhoids: Secondary | ICD-10-CM | POA: Diagnosis not present

## 2022-04-09 LAB — CBC WITH DIFFERENTIAL/PLATELET
Basophils Absolute: 0.1 10*3/uL (ref 0.0–0.1)
Basophils Relative: 0.9 % (ref 0.0–3.0)
Eosinophils Absolute: 0.1 10*3/uL (ref 0.0–0.7)
Eosinophils Relative: 2.1 % (ref 0.0–5.0)
HCT: 45.8 % (ref 39.0–52.0)
Hemoglobin: 15.3 g/dL (ref 13.0–17.0)
Lymphocytes Relative: 37 % (ref 12.0–46.0)
Lymphs Abs: 2.4 10*3/uL (ref 0.7–4.0)
MCHC: 33.3 g/dL (ref 30.0–36.0)
MCV: 89.5 fl (ref 78.0–100.0)
Monocytes Absolute: 0.6 10*3/uL (ref 0.1–1.0)
Monocytes Relative: 8.4 % (ref 3.0–12.0)
Neutro Abs: 3.4 10*3/uL (ref 1.4–7.7)
Neutrophils Relative %: 51.6 % (ref 43.0–77.0)
Platelets: 204 10*3/uL (ref 150.0–400.0)
RBC: 5.12 Mil/uL (ref 4.22–5.81)
RDW: 13.7 % (ref 11.5–15.5)
WBC: 6.6 10*3/uL (ref 4.0–10.5)

## 2022-04-09 LAB — COMPREHENSIVE METABOLIC PANEL
ALT: 18 U/L (ref 0–53)
AST: 18 U/L (ref 0–37)
Albumin: 4.1 g/dL (ref 3.5–5.2)
Alkaline Phosphatase: 111 U/L (ref 39–117)
BUN: 18 mg/dL (ref 6–23)
CO2: 23 mEq/L (ref 19–32)
Calcium: 9.2 mg/dL (ref 8.4–10.5)
Chloride: 107 mEq/L (ref 96–112)
Creatinine, Ser: 1.23 mg/dL (ref 0.40–1.50)
GFR: 59.94 mL/min — ABNORMAL LOW (ref >60.00)
Glucose, Bld: 93 mg/dL (ref 70–99)
Potassium: 4.2 mEq/L (ref 3.5–5.1)
Sodium: 139 mEq/L (ref 135–145)
Total Bilirubin: 0.5 mg/dL (ref 0.2–1.2)
Total Protein: 7.2 g/dL (ref 6.0–8.3)

## 2022-04-09 MED ORDER — NA SULFATE-K SULFATE-MG SULF 17.5-3.13-1.6 GM/177ML PO SOLN: 1.0000 | Freq: Once | ORAL | 0 refills | Status: AC

## 2022-04-09 NOTE — Progress Notes (Signed)
Agree with assessment and plan as outlined with the following suggestions: Would hold Coumadin for 5 days, renal function should not influence duration of how long this is held. If his DVT and PE are remote, hopefully he does not warrant a Lovenox bridge.  This is usually reserved for high risk patients with more recent clotting disorders, unless he has had a clot while holding this in the past.  I would defer to his prescribing physician to determine if he warrants a Lovenox bridge or not.,  Hopefully they can clarify that for [place]  Thanks

## 2022-04-09 NOTE — Patient Instructions (Addendum)
Your provider has requested that you go to the basement level for lab work before leaving today. Press "B" on the elevator. The lab is located at the first door on the left as you exit the elevator.   You have been scheduled for a colonoscopy. Please follow written instructions given to you at your visit today.  Please pick up your prep supplies at the pharmacy within the next 1-3 days. If you use inhalers (even only as needed), please bring them with you on the day of your procedure.  If you are age 48 or older, your body mass index should be between 23-30. Your Body mass index is 35.52 kg/m. If this is out of the aforementioned range listed, please consider follow up with your Primary Care Provider.  If you are age 31 or younger, your body mass index should be between 19-25. Your Body mass index is 35.52 kg/m. If this is out of the aformentioned range listed, please consider follow up with your Primary Care Provider.   ________________________________________________________  The [facility] providers would like to encourage you to use [facility] to communicate with providers for non-urgent requests or questions.  Due to long hold times on the telephone, sending your provider a message by [facility] may be a faster and more efficient way to get a response.  Please allow 48 business hours for a response.  Please remember that this is for non-urgent requests.  _______________________________________________________

## 2022-04-09 NOTE — Progress Notes (Signed)
Chief Complaint: Discuss colonoscopy on chronic anticoagulation  HPI:    Juan [name] is a  71 y/o Caucasian male who was referred to me by Donald Prose, MD for discussion of a colonoscopy on chronic anticoagulation.    Today, the patient presents to clinic and tells me that he came here 20+ years ago for a colonoscopy and was told he had polyps and needed to repeat in 5 years.  He is aware that he is a little "off".  Tells me he is ready to have his next colonoscopy.  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  He follows with Dr. Nancy Fetter at [facility] to monitor this and just had labs a couple of weeks ago.  Previously had to have prostate biopsies and had to come off of his Coumadin and was put on Lovenox instead.  Denies any GI complaints or concerns.    Briefly discusses a hemorrhoid which has been there for some time he has not tried anything over-the-counter but wonders if it can be fixed at the time of his colonoscopy.    Denies fever, chills, weight loss or blood in his stool.  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 Jeffie Pollock   KNEE ARTHROSCOPY W/ ACL RECONSTRUCTION Right 1980's   LITHOTRIPSY  1990's   RADIOACTIVE SEED IMPLANT N/A 05/12/2019   Procedure: RADIOACTIVE SEED IMPLANT/BRACHYTHERAPY IMPLANT;  Surgeon: Irine Seal, MD;  Location: [facility];  Service: Urology;  Laterality: N/A;   SPACE OAR INSTILLATION N/A 05/12/2019    Procedure: SPACE OAR INSTILLATION;  Surgeon: Irine Seal, MD;  Location: [facility];  Service: Urology;  Laterality: N/A;    Current Outpatient Medications  Medication Sig Dispense Refill   enoxaparin (LOVENOX) 80 MG/0.8ML injection Inject 1.6 mLs (160 mg total) into the skin daily. (Patient not taking: Reported on 05/28/2019) 7 Syringe 1   warfarin (COUMADIN) 5 MG tablet Take 1 tablet (5 mg total) by mouth daily. (Patient taking differently: Take 10 mg by mouth daily. ) 30 tablet 1   No current facility-administered medications for this visit.    Allergies as of 04/09/2022   (No Known Allergies)    Family History  Problem Relation Age of Onset   Prostate cancer Neg Hx     Social History   Socioeconomic History   Marital status: Married    Spouse name: Not on file   Number of children: Not on file   Years of education: Not on file   Highest education level: Not on file  Occupational History   Not on file  Tobacco Use   Smoking status: Never   Smokeless tobacco: Never  Vaping Use   Vaping Use: Never used  Substance and Sexual Activity   Alcohol use: Yes    Comment: 05/26/2013 "once/month, maybe a beer"   Drug use: No  Sexual activity: Yes  Other Topics Concern   Not on file  Social History Narrative   ** Merged History Encounter **       Social Determinants of Health   Financial Resource Strain: Not on file  Food Insecurity: Not on file  Transportation Needs: Not on file  Physical Activity: Not on file  Stress: Not on file  Social Connections: Not on file  Intimate Partner Violence: Not on file    Review of Systems:    Constitutional: No weight loss, fever or chills Skin: No rash  Cardiovascular: No chest pain Respiratory: No SOB Gastrointestinal: See HPI and otherwise negative Genitourinary: No dysuria  Neurological: No headache, dizziness or syncope Musculoskeletal: No new muscle or joint pain Hematologic: No bleeding  Psychiatric:  No history of depression or anxiety   Physical Exam:  Vital signs: BP 122/78   Pulse 68   Ht 5' 9.5" (1.765 m)   Wt 244 lb (110.7 kg)   SpO2 96%   BMI 35.52 kg/m    Constitutional:   Pleasant Caucasian male appears to be in NAD, Well developed, Well nourished, alert and cooperative Head:  Normocephalic and atraumatic. Eyes:   PEERL, EOMI. No icterus. Conjunctiva pink. Ears:  Normal auditory acuity. Neck:  Supple Throat: Oral cavity and pharynx without inflammation, swelling or lesion.  Respiratory: Respirations even and unlabored. Lungs clear to auscultation bilaterally.   No wheezes, crackles, or rhonchi.  Cardiovascular: Normal S1, S2. No MRG. Regular rate and rhythm. No peripheral edema, cyanosis or pallor.  Gastrointestinal:  Soft, nondistended, nontender. No rebound or guarding. Normal bowel sounds. No appreciable masses or hepatomegaly. Rectal: Declined Msk:  Symmetrical without gross deformities. Without edema, no deformity or joint abnormality.  Neurologic:  Alert and  oriented x4;  grossly normal neurologically.  Skin:   Dry and intact without significant lesions or rashes. Psychiatric:  Demonstrates good judgement and reason without abnormal affect or behaviors.  Requesting recent labs.  Assessment: 1.  History of colon polyps: Last colonoscopy 20 years ago with recommendations to repeat in 5 years, patient is overdue now 2.  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  Hemorrhoid: Patient declines rectal exam but would like this looked at further at time of colonoscopy to see what we can do for it  Plan: 1.  We are requesting patient's most recent labs to determine kidney function.  This will help [place] determine if patient needs to hold his Coumadin for 5-7 days.  It appears that he was told to go on Lovenox before when he had to stop this medication and likely is recommendation will remain the same from his prescribing physician.  This was discussed  with him in detail today.  We will communicate with his prescribing physician and appreciate their recommendations. 2.  Patient scheduled for surveillance colonoscopy in the [place] with Dr. Havery Moros.  He requests this be done before the second week of July as his wife is having surgery.  We will schedule towards the end of June/beginning of July to give [place] time to get Coumadin clearance as above. Patient is appropriate for endoscopic procedure(s) in the ambulatory ([place]) setting. 3.  Patient does discuss a hemorrhoid today which has been there for some years, he typically just "deals with it", it is itchy at times.  He has not tried anything over-the-counter.  Discussed he should try over-the-counter Preparation H.  This can be looked at in detail at time of colonoscopy to see  if it is amenable to banding.  Did discuss that this may be more difficult for him to get done as he is on a blood thinner. 4.  Patient to follow in clinic per recommendations from Dr. Havery Moros after time of procedure.  Ellouise Newer, PA-C [facility] 04/09/2022, 9:23 AM  Cc: Donald Prose, MD

## 2022-04-11 NOTE — Telephone Encounter (Signed)
Faxed request to Dr. Nancy Fetter office.

## 2022-04-16 NOTE — Telephone Encounter (Signed)
Left message for patient to return call. We have received clearance from Dr Nancy Fetter. Patient needs Lovenox bridge. Tried to call [facility] to ensure they will set up this treatment, was on hold for 12 min. Had to hang up for a incoming call.

## 2022-04-18 ENCOUNTER — Other Ambulatory Visit: Payer: Self-pay

## 2022-04-18 MED ORDER — NA SULFATE-K SULFATE-MG SULF 17.5-3.13-1.6 GM/177ML PO SOLN: 1.0000 | Freq: Once | ORAL | 0 refills | Status: AC

## 2022-04-18 NOTE — Telephone Encounter (Signed)
Spoke to Juan [name]. He states he has an appointment on Friday with his coumadin clinic. They will then get him set for the Lovenox bridge.

## 2022-04-27 ENCOUNTER — Telehealth: Payer: Self-pay | Admitting: Physician Assistant

## 2022-04-27 MED ORDER — NA SULFATE-K SULFATE-MG SULF 17.5-3.13-1.6 GM/177ML PO SOLN: 1.0000 | Freq: Once | ORAL | 0 refills | Status: AC

## 2022-04-27 NOTE — Telephone Encounter (Signed)
Resent script to [facility].

## 2022-04-27 NOTE — Telephone Encounter (Signed)
Inbound call from patient stating that [facility] does not have prescription for Suprep for patiens colon procedure on 7/7. Requesting it be sent to [facility]. Please advise.

## 2022-05-04 ENCOUNTER — Ambulatory Visit (AMBULATORY_SURGERY_CENTER): Payer: PPO | Admitting: Gastroenterology

## 2022-05-04 ENCOUNTER — Encounter: Payer: Self-pay | Admitting: Gastroenterology

## 2022-05-04 VITALS — BP 110/78 | HR 58 | Temp 98.0°F | Resp 14 | Ht 69.0 in | Wt 244.0 lb

## 2022-05-04 DIAGNOSIS — Z86010 Personal history of colonic polyps: Secondary | ICD-10-CM

## 2022-05-04 DIAGNOSIS — D125 Benign neoplasm of sigmoid colon: Secondary | ICD-10-CM

## 2022-05-04 DIAGNOSIS — D124 Benign neoplasm of descending colon: Secondary | ICD-10-CM

## 2022-05-04 DIAGNOSIS — D127 Benign neoplasm of rectosigmoid junction: Secondary | ICD-10-CM | POA: Diagnosis not present

## 2022-05-04 DIAGNOSIS — Z800 Family history of malignant neoplasm of digestive organs: Secondary | ICD-10-CM

## 2022-05-04 DIAGNOSIS — D123 Benign neoplasm of transverse colon: Secondary | ICD-10-CM

## 2022-05-04 DIAGNOSIS — D122 Benign neoplasm of ascending colon: Secondary | ICD-10-CM | POA: Diagnosis not present

## 2022-05-04 DIAGNOSIS — D128 Benign neoplasm of rectum: Secondary | ICD-10-CM

## 2022-05-04 DIAGNOSIS — Z09 Encounter for follow-up examination after completed treatment for conditions other than malignant neoplasm: Secondary | ICD-10-CM

## 2022-05-04 DIAGNOSIS — D120 Benign neoplasm of cecum: Secondary | ICD-10-CM

## 2022-05-04 MED ORDER — SODIUM CHLORIDE 0.9 % IV SOLN: 500.0000 mL | Freq: Once | INTRAVENOUS | Status: DC

## 2022-05-04 NOTE — Patient Instructions (Addendum)
RESUME LOVENOX TOMORROW 05/05/22. Duration of Lovenox per Dr. Nancy Fetter   Handout on polyps, diverticulosis, and hemorrhoids and hemorrhoid banding  provided   Await pathology results.   Continue current medications.   YOU HAD AN ENDOSCOPIC PROCEDURE TODAY AT [facility]:   Refer to the procedure report that was given to you for any specific questions about what was found during the examination.  If the procedure report does not answer your questions, please call your gastroenterologist to clarify.  If you requested that your care partner not be given the details of your procedure findings, then the procedure report has been included in a sealed envelope for you to review at your convenience later.  YOU SHOULD EXPECT: Some feelings of bloating in the abdomen. Passage of more gas than usual.  Walking can help get rid of the air that was put into your GI tract during the procedure and reduce the bloating. If you had a lower endoscopy (such as a colonoscopy or flexible sigmoidoscopy) you may notice spotting of blood in your stool or on the toilet paper. If you underwent a bowel prep for your procedure, you may not have a normal bowel movement for a few days.  Please Note:  You might notice some irritation and congestion in your nose or some drainage.  This is from the oxygen used during your procedure.  There is no need for concern and it should clear up in a day or so.  SYMPTOMS TO REPORT IMMEDIATELY:  Following lower endoscopy (colonoscopy or flexible sigmoidoscopy):  Excessive amounts of blood in the stool  Significant tenderness or worsening of abdominal pains  Swelling of the abdomen that is new, acute  Fever of 100F or higher  For urgent or emergent issues, a gastroenterologist can be reached at any hour by calling [phone]. Do not use MyChart messaging for urgent concerns.    DIET:  We do recommend a small meal at first, but then you may proceed to your regular diet.   Drink plenty of fluids but you should avoid alcoholic beverages for 24 hours.  ACTIVITY:  You should plan to take it easy for the rest of today and you should NOT DRIVE or use heavy machinery until tomorrow (because of the sedation medicines used during the test).    FOLLOW UP: Our staff will call the number listed on your records the next business day following your procedure.  We will call around 7:15- 8:00 am to check on you and address any questions or concerns that you may have regarding the information given to you following your procedure. If we do not reach you, we will leave a message.  If you develop any symptoms (ie: fever, flu-like symptoms, shortness of breath, cough etc.) before then, please call [phone].  If you test positive for Covid 19 in the 2 weeks post procedure, please call and report this information to [place].    If any biopsies were taken you will be contacted by phone or by letter within the next 1-3 weeks.  Please call us at [phone] if you have not heard about the biopsies in 3 weeks.    SIGNATURES/CONFIDENTIALITY: You and/or your care partner have signed paperwork which will be entered into your electronic medical record.  These signatures attest to the fact that that the information above on your After Visit Summary has been reviewed and is understood.  Full responsibility of the confidentiality of this discharge information lies with you and/or your  care-partner.

## 2022-05-04 NOTE — Progress Notes (Signed)
[facility] History and Physical   Primary Care Physician:  Donald Prose, MD   Reason for Procedure:   History of colon polyps, FH of colon cancer  Plan:    colonoscopy     HPI: Juan [name] is a 71 y.o. male  here for colonoscopy surveillance - polyps removed in the past, his last exam was > 20 years ago. Father had colon cancer dx age 55. 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 He is on a lovenox bridge per his primary MD. Patient denies any bowel symptoms at this time. Otherwise feels well without any cardiopulmonary symptoms. Have discussed risks / benefits and he wants to proceed.   Past Medical History:  Diagnosis Date   Cancer ([place]) 2019   right forearm melanoma, prostate cancer(05/12/2019)   History of kidney stones    Pulmonary embolism ([place]) 05/26/2013   Pulmonary nodule    right upper lobe   Right leg DVT ([place]) 05/26/2013   Shortness of breath    "@ anytime over the last 5 weeks; probably from the blood clots" (05/26/2013)   Wears glasses     Past Surgical History:  Procedure Laterality Date   COLONOSCOPY     CYSTOSCOPY N/A 05/12/2019   Procedure: CYSTOSCOPY;  Surgeon: Irine Seal, MD;  Location: [facility];  Service: Urology;  Laterality: N/A;  No seeds detetcted in bladder per Dr. Jeffie Pollock   KNEE ARTHROSCOPY W/ ACL RECONSTRUCTION Right 1980's   LITHOTRIPSY  1990's   MELANOMA EXCISION Right 2019   forearm   RADIOACTIVE SEED IMPLANT N/A 05/12/2019   Procedure: RADIOACTIVE SEED IMPLANT/BRACHYTHERAPY IMPLANT;  Surgeon: Irine Seal, MD;  Location: [facility];  Service: Urology;  Laterality: N/A;   SPACE OAR INSTILLATION N/A 05/12/2019   Procedure: SPACE OAR INSTILLATION;  Surgeon: Irine Seal, MD;  Location[facility];  Service: Urology;  Laterality: N/A;    Prior to Admission medications   Medication Sig Start Date End Date Taking? Authorizing Provider  enoxaparin  (LOVENOX) 80 MG/0.8ML injection Inject into the skin. 04/24/22  Yes [provider]  tamsulosin (FLOMAX) 0.4 MG CAPS capsule Take 0.4 mg by mouth daily. 02/19/22  Yes [provider]  warfarin (COUMADIN) 5 MG tablet Take 1 tablet (5 mg total) by mouth daily. Patient taking differently: Take 10 mg by mouth daily. 10/01/15   Caren Griffins, MD    Current Outpatient Medications  Medication Sig Dispense Refill   enoxaparin (LOVENOX) 80 MG/0.8ML injection Inject into the skin.     tamsulosin (FLOMAX) 0.4 MG CAPS capsule Take 0.4 mg by mouth daily.     warfarin (COUMADIN) 5 MG tablet Take 1 tablet (5 mg total) by mouth daily. (Patient taking differently: Take 10 mg by mouth daily.) 30 tablet 1   Current Facility-Administered Medications  Medication Dose Route Frequency Provider Last Rate Last Admin   0.9 %  sodium chloride infusion  500 mL Intravenous Once Torrin Frein, Carlota Raspberry, MD        Allergies as of 05/04/2022   (No Known Allergies)    Family History  Problem Relation Age of Onset   Hemochromatosis Mother    Colon polyps Father    Heart failure Father    Skin cancer Father    Colon cancer Father    Prostate cancer Neg Hx    Esophageal cancer Neg Hx    Stomach cancer Neg Hx    Rectal  cancer Neg Hx     Social History   Socioeconomic History   Marital status: Married    Spouse name: Not on file   Number of children: 0   Years of education: Not on file   Highest education level: Not on file  Occupational History   Occupation: Electrical engineer  Tobacco Use   Smoking status: Never   Smokeless tobacco: Never  Vaping Use   Vaping Use: Never used  Substance and Sexual Activity   Alcohol use: Yes    Comment: 05/26/2013 "once/month, maybe a beer", 2023-rarely less than monthly   Drug use: Never   Sexual activity: Yes  Other Topics Concern   Not on file  Social History Narrative   ** Merged History Encounter **       Social Determinants of Health   Financial  Resource Strain: Not on file  Food Insecurity: Not on file  Transportation Needs: Not on file  Physical Activity: Not on file  Stress: Not on file  Social Connections: Not on file  Intimate Partner Violence: Not on file    Review of Systems: All other review of systems negative except as mentioned in the HPI.  Physical Exam: Vital signs BP 132/81   Pulse 76   Temp 98 F (36.7 C) (Temporal)   Resp 10   Ht '5\' 9"'$  (1.753 m)   Wt 244 lb (110.7 kg)   SpO2 99%   BMI 36.03 kg/m   General:   Alert,  Well-developed, pleasant and cooperative in NAD Lungs:  Clear throughout to auscultation.   Heart:  Regular rate and rhythm Abdomen:  Soft, nontender and nondistended.   Neuro/Psych:  Alert and cooperative. Normal mood and affect. A and O x 3  Jolly Mango, MD [facility]

## 2022-05-04 NOTE — Op Note (Signed)
[facility] Patient Name: Juan [name] Procedure Date: 05/04/2022 8:31 AM MRN: [id number] Endoscopist: Remo Lipps P. Havery Moros , MD Age: 71 Referring MD:  Date of Birth: 06/29/1951 Gender: Male Account #: [id number] Procedure:                Colonoscopy Indications:              High risk colon cancer surveillance: Personal                            history of colonic polyps - reported last exam > 20                            years ago, father dx with colon cancer age 29 Medicines:                Monitored Anesthesia Care Procedure:                Pre-Anesthesia Assessment:                           - Prior to the procedure, a History and Physical                            was performed, and patient medications and                            allergies were reviewed. The patient's tolerance of                            previous anesthesia was also reviewed. The risks                            and benefits of the procedure and the sedation                            options and risks were discussed with the patient.                            All questions were answered, and informed consent                            was obtained. Prior Anticoagulants: The patient has                            taken Coumadin (warfarin), last dose was 5 days                            prior to procedure. ASA Grade Assessment: III - A                            patient with severe systemic disease. After                            reviewing the risks and benefits, the patient was  deemed in satisfactory condition to undergo the                            procedure.                           After obtaining informed consent, the colonoscope                            was passed under direct vision. Throughout the                            procedure, the patient's blood pressure, pulse, and                            oxygen saturations were monitored continuously.  The                            CF [id number] #[id number] was introduced through the anus                            and advanced to the the cecum, identified by                            appendiceal orifice and ileocecal valve. The                            colonoscopy was performed without difficulty. The                            patient tolerated the procedure well. The quality                            of the bowel preparation was good. The ileocecal                            valve, appendiceal orifice, and rectum were                            photographed. Scope In: 8:42:26 AM Scope Out: 9:05:16 AM Scope Withdrawal Time: 0 hours 18 minutes 35 seconds  Total Procedure Duration: 0 hours 22 minutes 50 seconds  Findings:                 Skin tags were found on perianal exam.                           A 4 to 5 mm polyp was found in the cecum. The polyp                            was flat. The polyp was removed with a cold snare.                            Resection and retrieval were complete.  A 2 to 3 mm polyp was found in the ascending colon.                            The polyp was sessile. The polyp was removed with a                            cold snare. Resection and retrieval were complete.                           Two sessile polyps were found in the transverse                            colon. The polyps were 3 to 4 mm in size. These                            polyps were removed with a cold snare. Resection                            and retrieval were complete.                           A 5 mm polyp was found in the descending colon. The                            polyp was flat. The polyp was removed with a cold                            snare. Resection and retrieval were complete.                           A diminutive polyp was found in the sigmoid colon.                            The polyp was sessile. The polyp was removed with a                             cold snare. Resection and retrieval were complete.                           Two sessile polyps were found in the rectum. The                            polyps were 3 mm in size. These polyps were removed                            with a cold snare. Resection and retrieval were                            complete.                           A few small-mouthed diverticula  were found in the                            sigmoid colon.                           Internal hemorrhoids were found during retroflexion.                           The exam was otherwise without abnormality. Complications:            No immediate complications. Estimated blood loss:                            Minimal. Estimated Blood Loss:     Estimated blood loss was minimal. Impression:               - Perianal skin tags found on perianal exam.                           - One 4 to 5 mm polyp in the cecum, removed with a                            cold snare. Resected and retrieved.                           - One 2 to 3 mm polyp in the ascending colon,                            removed with a cold snare. Resected and retrieved.                           - Two 3 to 4 mm polyps in the transverse colon,                            removed with a cold snare. Resected and retrieved.                           - One 5 mm polyp in the descending colon, removed                            with a cold snare. Resected and retrieved.                           - One diminutive polyp in the sigmoid colon,                            removed with a cold snare. Resected and retrieved.                           - Two 3 mm polyps in the rectum, removed with a                            cold snare. Resected and  retrieved.                           - Diverticulosis in the sigmoid colon.                           - Internal hemorrhoids.                           - The examination was otherwise normal. Recommendation:            - Patient has a contact number available for                            emergencies. The signs and symptoms of potential                            delayed complications were discussed with the                            patient. Return to normal activities tomorrow.                            Written discharge instructions were provided to the                            patient.                           - Resume previous diet.                           - Continue present medications.                           - Resume Coumadin tonight                           - Resume lovenox tomorrow - duration of lovenox per                            Dr. Nancy Fetter                           - Await pathology results. Remo Lipps P. Pasha Gadison, MD 05/04/2022 9:12:59 AM This report has been signed electronically.

## 2022-05-04 NOTE — Progress Notes (Signed)
Medical record updated 

## 2022-05-04 NOTE — Progress Notes (Signed)
PT taken to PACU. Monitors in place. VSS. Report given to RN. 

## 2022-05-04 NOTE — Progress Notes (Signed)
Called to room to assist during endoscopic procedure.  Patient ID and intended procedure confirmed with present staff. Received instructions for my participation in the procedure from the performing physician.  

## 2022-05-07 ENCOUNTER — Telehealth: Payer: Self-pay

## 2022-05-07 NOTE — Telephone Encounter (Signed)
No answer, left message to call if having any issues or concerns, B.Joden Bonsall RN 

## 2024-11-17 ENCOUNTER — Ambulatory Visit: Payer: Self-pay

## 2024-11-17 NOTE — Telephone Encounter (Signed)
 FYI Only or Action Required?: FYI only for provider: Going to UC.  Patient was last seen in primary care on .  Called Nurse Triage reporting Leg Swelling.  Symptoms began several weeks ago.  Interventions attempted: Nothing.  Symptoms are: stable.  Triage Disposition: Home Care  Patient/caregiver understands and will follow disposition?: Yes    Reason for Triage: Patient stated his lower calf in his right leg is swollen states if he sits or stand for an hour swells, has an history of blood clots, states no pain but there's discomfort keeps it elevated and it goes down but on his feet or sitting too long swells back up  Reason for Disposition  [1] Localized swelling (e.g., small area of puffy or swollen skin) AND [2] itchy  Answer Assessment - Initial Assessment Questions Swelling comes on when leg is dependent.  Is going to UC   1. ONSET: When did the swelling start? (e.g., minutes, hours, days)     Several weeks 2. LOCATION: What part of the leg is swollen?  Are both legs swollen or just one leg?     Left leg 3. SEVERITY: How bad is the swelling? (e.g., localized; mild, moderate, severe)     No swelling at this time.  4. REDNESS: Is there redness or signs of infection?     denies 5. PAIN: Is the swelling painful to touch? If Yes, ask: How painful is it?   (Scale 1-10; mild, moderate or severe)     denies 6. FEVER: Do you have a fever? If Yes, ask: What is it, how was it measured, and when did it start?      no 7. CAUSE: What do you think is causing the leg swelling?     unknown 8. MEDICAL HISTORY: Do you have a history of blood clots (e.g., DVT), cancer, heart failure, kidney disease, or liver failure?     Hx blood clots 9. RECURRENT SYMPTOM: Have you had leg swelling before? If Yes, ask: When was the last time? What happened that time?     yes 10. OTHER SYMPTOMS: Do you have any other symptoms? (e.g., chest pain, difficulty breathing)        Denies sob, cp  Protocols used: Leg Swelling and Edema-A-AH

## 2024-12-29 ENCOUNTER — Ambulatory Visit: Admitting: Internal Medicine
